# Patient Record
Sex: Male | Born: 1980 | Race: White | Hispanic: No | Marital: Single | Smoking: Current every day smoker
Health system: Southern US, Community
[De-identification: ages and names within clinical notes are randomized; demographics above are authoritative.]

## PROBLEM LIST (undated history)

## (undated) DIAGNOSIS — I10 Essential (primary) hypertension: Secondary | ICD-10-CM

## (undated) HISTORY — PX: BACK SURGERY: SHX140

---
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
## 1999-05-26 ENCOUNTER — Emergency Department (HOSPITAL_COMMUNITY): Admission: EM | Admit: 1999-05-26 | Discharge: 1999-05-26 | Payer: Self-pay | Admitting: Emergency Medicine

## 1999-05-26 ENCOUNTER — Encounter: Payer: Self-pay | Admitting: Emergency Medicine

## 1999-06-26 ENCOUNTER — Encounter: Payer: Self-pay | Admitting: Emergency Medicine

## 1999-06-27 ENCOUNTER — Inpatient Hospital Stay (HOSPITAL_COMMUNITY): Admission: EM | Admit: 1999-06-27 | Discharge: 1999-06-28 | Payer: Self-pay | Admitting: Emergency Medicine

## 2002-07-26 ENCOUNTER — Inpatient Hospital Stay (HOSPITAL_COMMUNITY): Admission: AD | Admit: 2002-07-26 | Discharge: 2002-07-30 | Payer: Self-pay | Admitting: Emergency Medicine

## 2003-12-19 ENCOUNTER — Ambulatory Visit (HOSPITAL_COMMUNITY): Admission: RE | Admit: 2003-12-19 | Discharge: 2003-12-20 | Payer: Self-pay | Admitting: Neurosurgery

## 2003-12-31 ENCOUNTER — Emergency Department (HOSPITAL_COMMUNITY): Admission: EM | Admit: 2003-12-31 | Discharge: 2003-12-31 | Payer: Self-pay | Admitting: Emergency Medicine

## 2004-08-05 ENCOUNTER — Emergency Department (HOSPITAL_COMMUNITY): Admission: EM | Admit: 2004-08-05 | Discharge: 2004-08-05 | Payer: Self-pay | Admitting: Family Medicine

## 2004-08-05 ENCOUNTER — Emergency Department (HOSPITAL_COMMUNITY): Admission: EM | Admit: 2004-08-05 | Discharge: 2004-08-05 | Payer: Self-pay | Admitting: Emergency Medicine

## 2004-09-14 ENCOUNTER — Emergency Department (HOSPITAL_COMMUNITY): Admission: AC | Admit: 2004-09-14 | Discharge: 2004-09-14 | Payer: Self-pay

## 2004-09-20 ENCOUNTER — Emergency Department (HOSPITAL_COMMUNITY): Admission: EM | Admit: 2004-09-20 | Discharge: 2004-09-20 | Payer: Self-pay | Admitting: *Deleted

## 2004-09-21 ENCOUNTER — Emergency Department (HOSPITAL_COMMUNITY): Admission: EM | Admit: 2004-09-21 | Discharge: 2004-09-21 | Payer: Self-pay | Admitting: Emergency Medicine

## 2004-10-11 ENCOUNTER — Emergency Department (HOSPITAL_COMMUNITY): Admission: EM | Admit: 2004-10-11 | Discharge: 2004-10-11 | Payer: Self-pay | Admitting: Emergency Medicine

## 2004-10-12 ENCOUNTER — Ambulatory Visit: Payer: Self-pay | Admitting: Nurse Practitioner

## 2005-04-11 ENCOUNTER — Emergency Department (HOSPITAL_COMMUNITY): Admission: EM | Admit: 2005-04-11 | Discharge: 2005-04-11 | Payer: Self-pay | Admitting: Emergency Medicine

## 2005-04-23 ENCOUNTER — Emergency Department (HOSPITAL_COMMUNITY): Admission: EM | Admit: 2005-04-23 | Discharge: 2005-04-24 | Payer: Self-pay | Admitting: Emergency Medicine

## 2005-05-03 ENCOUNTER — Emergency Department (HOSPITAL_COMMUNITY): Admission: EM | Admit: 2005-05-03 | Discharge: 2005-05-03 | Payer: Self-pay | Admitting: Emergency Medicine

## 2005-05-16 ENCOUNTER — Emergency Department (HOSPITAL_COMMUNITY): Admission: EM | Admit: 2005-05-16 | Discharge: 2005-05-16 | Payer: Self-pay | Admitting: Emergency Medicine

## 2005-05-31 ENCOUNTER — Emergency Department (HOSPITAL_COMMUNITY): Admission: EM | Admit: 2005-05-31 | Discharge: 2005-05-31 | Payer: Self-pay | Admitting: Emergency Medicine

## 2005-07-29 ENCOUNTER — Emergency Department (HOSPITAL_COMMUNITY): Admission: EM | Admit: 2005-07-29 | Discharge: 2005-07-29 | Payer: Self-pay | Admitting: *Deleted

## 2005-09-28 ENCOUNTER — Emergency Department (HOSPITAL_COMMUNITY): Admission: EM | Admit: 2005-09-28 | Discharge: 2005-09-28 | Payer: Self-pay | Admitting: Emergency Medicine

## 2005-10-23 ENCOUNTER — Emergency Department (HOSPITAL_COMMUNITY): Admission: EM | Admit: 2005-10-23 | Discharge: 2005-10-23 | Payer: Self-pay | Admitting: Emergency Medicine

## 2006-04-03 ENCOUNTER — Emergency Department (HOSPITAL_COMMUNITY): Admission: EM | Admit: 2006-04-03 | Discharge: 2006-04-04 | Payer: Self-pay | Admitting: Emergency Medicine

## 2006-05-17 ENCOUNTER — Emergency Department (HOSPITAL_COMMUNITY): Admission: EM | Admit: 2006-05-17 | Discharge: 2006-05-17 | Payer: Self-pay | Admitting: *Deleted

## 2006-07-21 ENCOUNTER — Emergency Department (HOSPITAL_COMMUNITY): Admission: EM | Admit: 2006-07-21 | Discharge: 2006-07-21 | Payer: Self-pay | Admitting: Emergency Medicine

## 2007-01-31 ENCOUNTER — Emergency Department (HOSPITAL_COMMUNITY): Admission: EM | Admit: 2007-01-31 | Discharge: 2007-01-31 | Payer: Self-pay | Admitting: Emergency Medicine

## 2007-04-17 ENCOUNTER — Emergency Department (HOSPITAL_COMMUNITY): Admission: EM | Admit: 2007-04-17 | Discharge: 2007-04-17 | Payer: Self-pay | Admitting: Emergency Medicine

## 2007-05-23 ENCOUNTER — Emergency Department (HOSPITAL_COMMUNITY): Admission: EM | Admit: 2007-05-23 | Discharge: 2007-05-23 | Payer: Self-pay | Admitting: Emergency Medicine

## 2007-05-28 ENCOUNTER — Emergency Department (HOSPITAL_COMMUNITY): Admission: EM | Admit: 2007-05-28 | Discharge: 2007-05-28 | Payer: Self-pay | Admitting: Emergency Medicine

## 2007-06-04 ENCOUNTER — Emergency Department (HOSPITAL_COMMUNITY): Admission: EM | Admit: 2007-06-04 | Discharge: 2007-06-04 | Payer: Self-pay | Admitting: Emergency Medicine

## 2007-06-19 ENCOUNTER — Inpatient Hospital Stay (HOSPITAL_COMMUNITY): Admission: EM | Admit: 2007-06-19 | Discharge: 2007-06-20 | Payer: Self-pay | Admitting: Emergency Medicine

## 2008-09-28 IMAGING — CR DG PELVIS 1-2V
1 series · 1 of 1 positions shown · non-contrast
Comparison: Intraoperative radiograph 12/19/2003

CLINICAL DATA: Trauma, fall, back pain

PELVIS - 1-2 VIEW

[t pelvis a.p.]
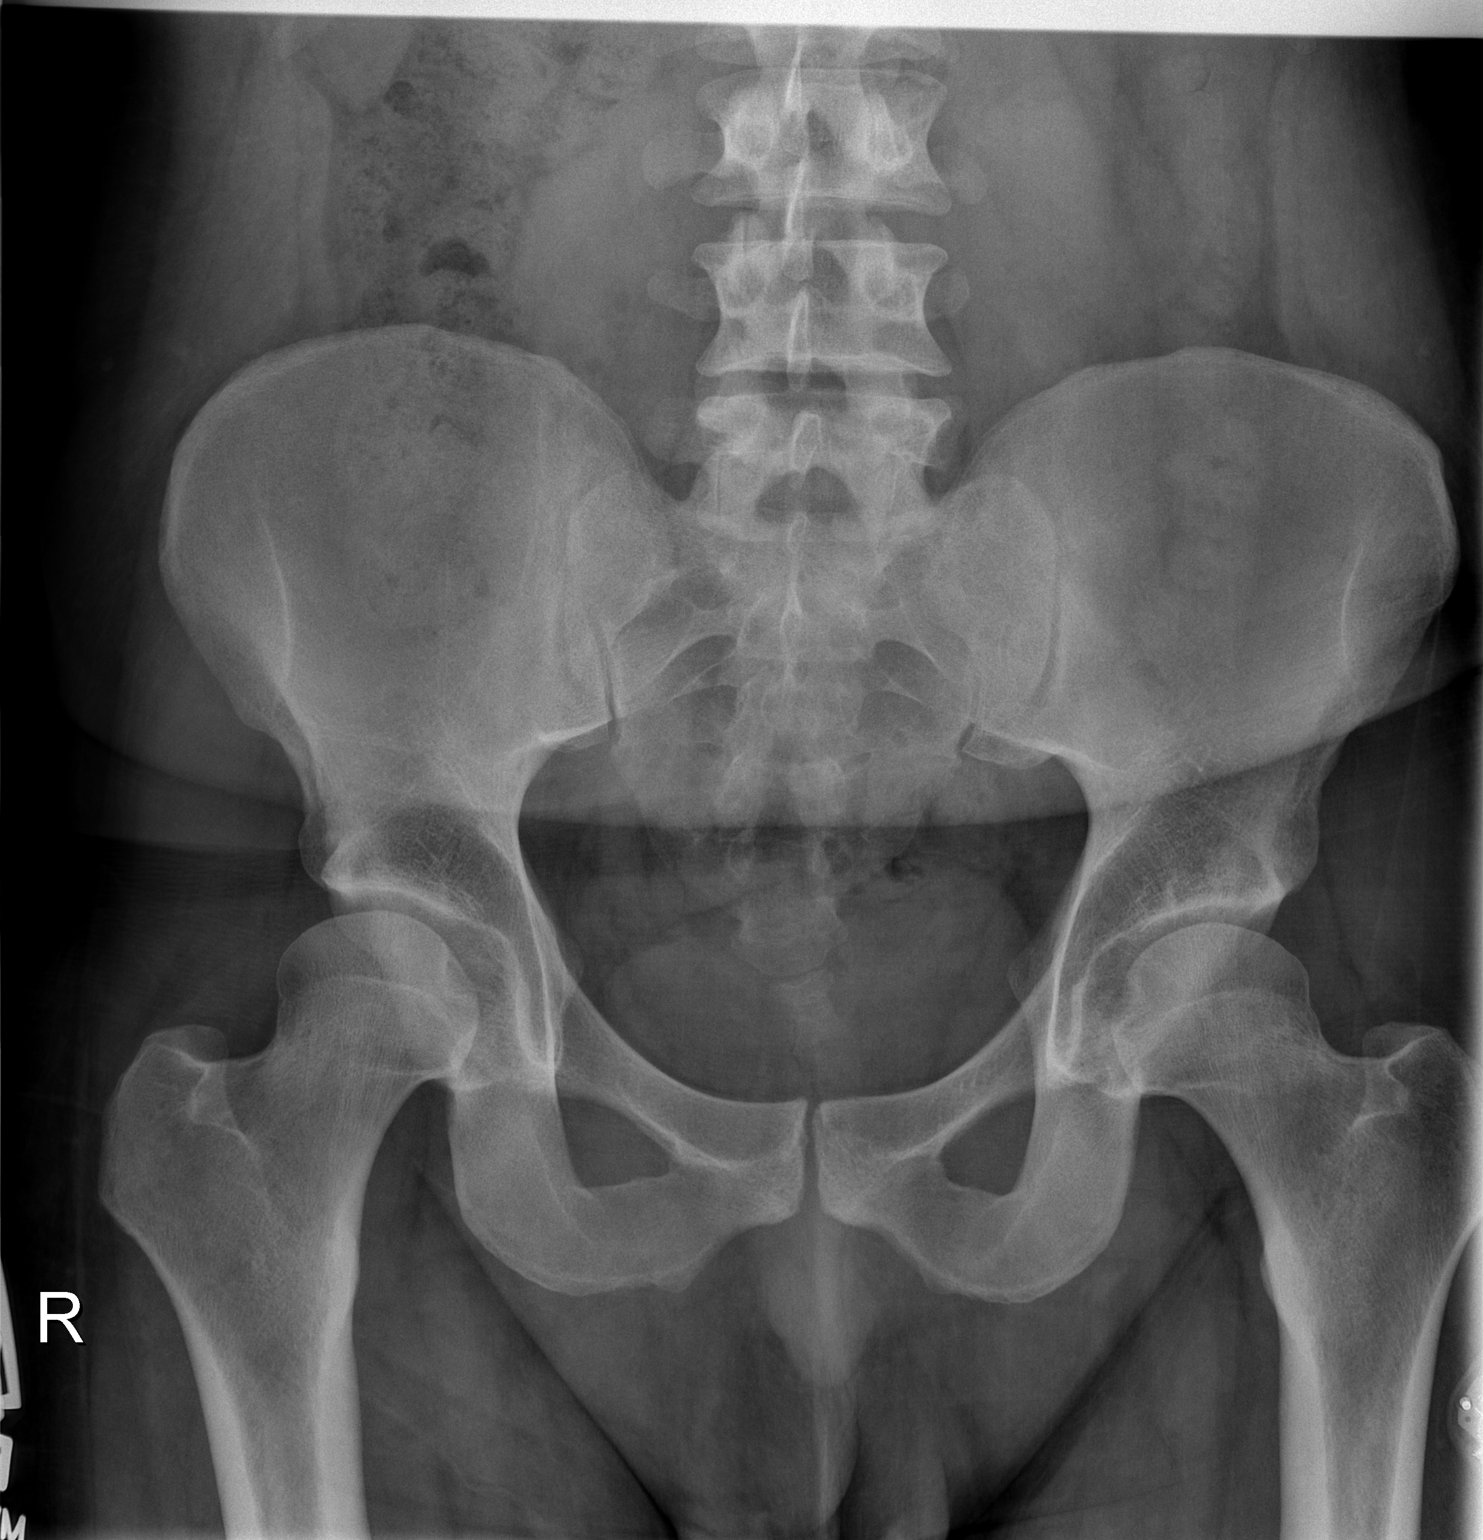

[1 of 1 positions shown; findings below may reference images not displayed]

FINDINGS: No fractures identified on this single AP pelvis.  No
acute abnormality is identified.
IMPRESSION: No acute abnormality.

## 2008-10-25 IMAGING — CT CT HEAD W/O CM
1 series · 16 of 30 positions shown, 20 images · non-contrast
Comparison: CT head 09/28/2005.

CLINICAL DATA: Possible overdose, medical clearance

CT HEAD WITHOUT CONTRAST
TECHNIQUE: Contiguous axial images were obtained from the base of
the skull through the vertex without contrast.

[Series 2: headseq 4.8 h37s · axial · 0.43mm/px · z∈[+1120,+1280]mm · 16 of 36 slices shown, 20 images]
[im 2/36  brain]
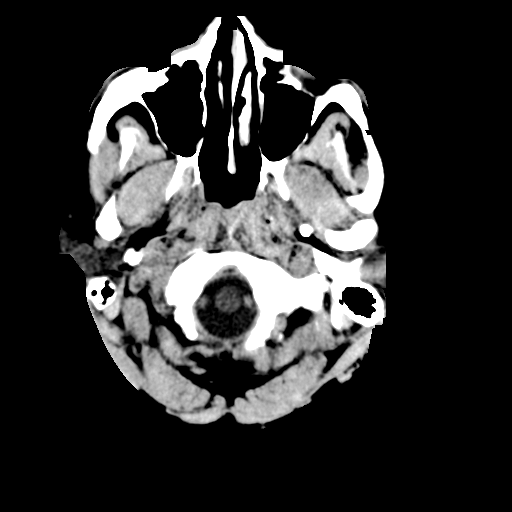
[im 2/36  bone]
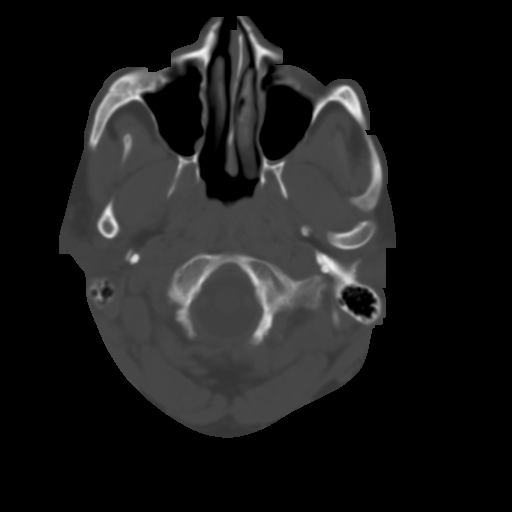
[im 4/36  brain]
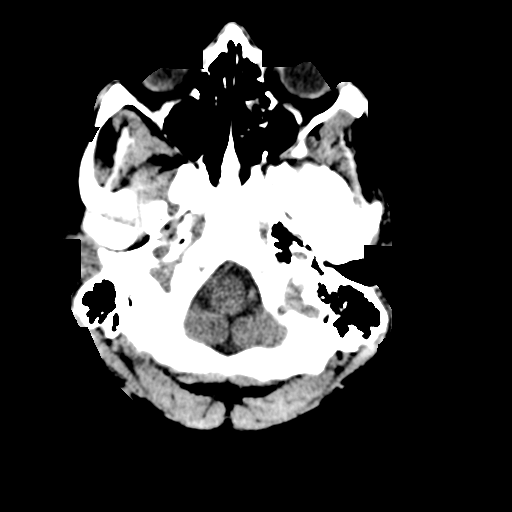
[im 7/36  brain]
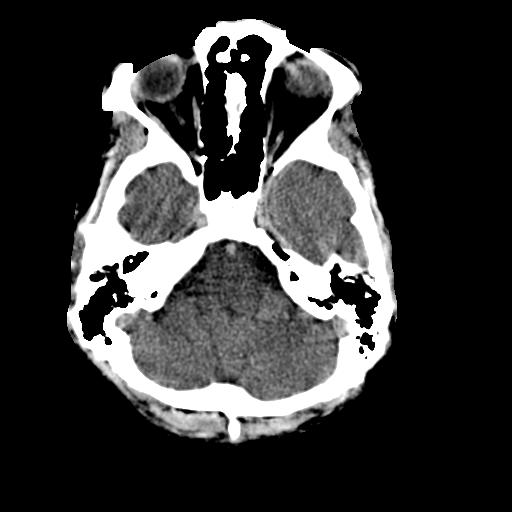
[im 9/36  brain]
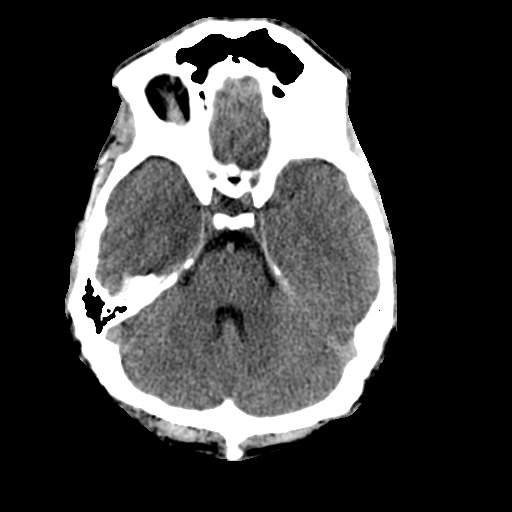
[im 10/36  brain]
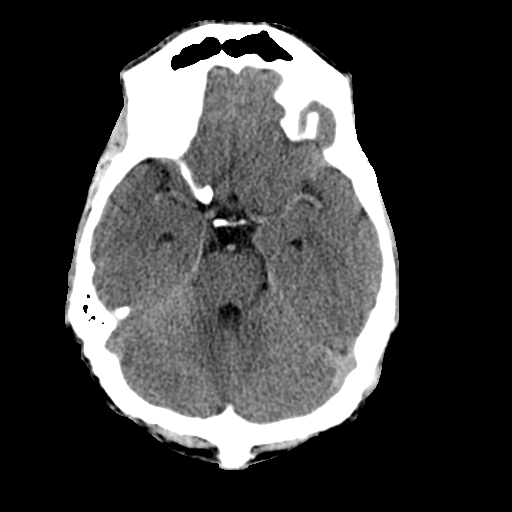
[im 10/36  bone]
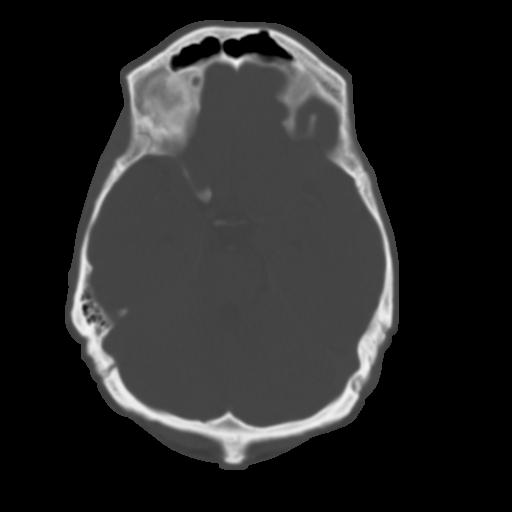
[im 13/36  brain]
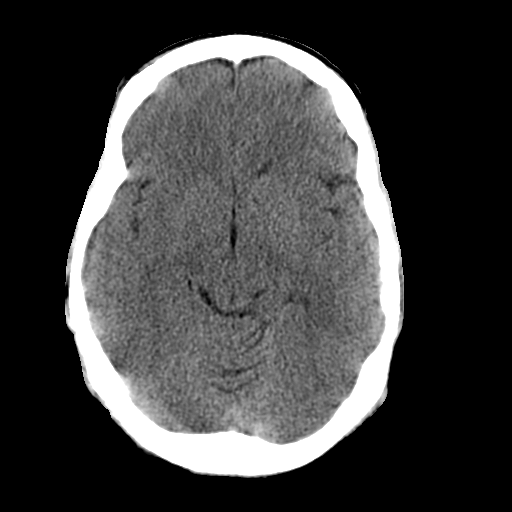
[im 15/36  brain]
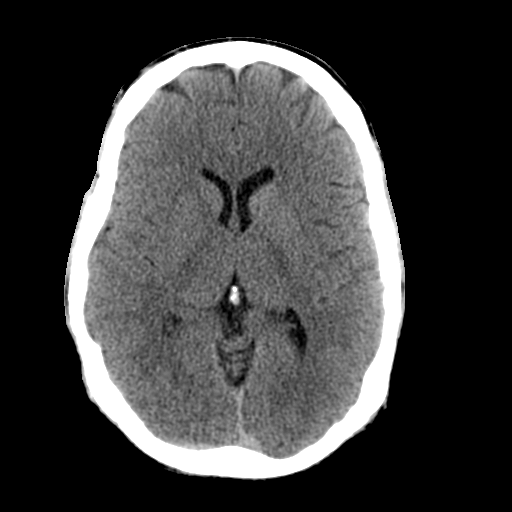
[im 17/36  brain]
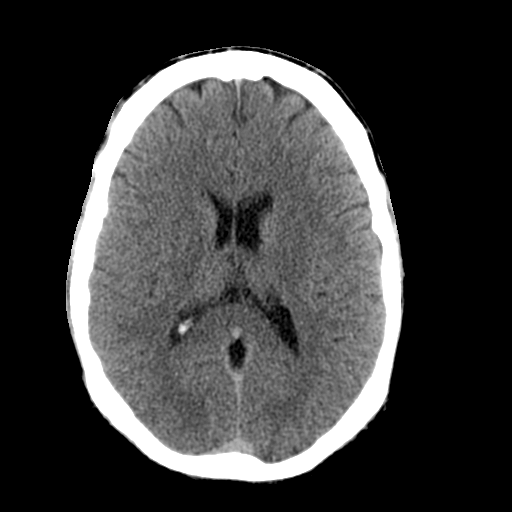
[im 19/36  brain]
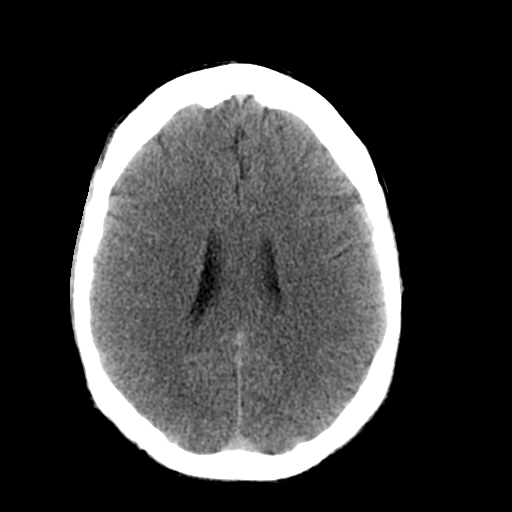
[im 19/36  bone]
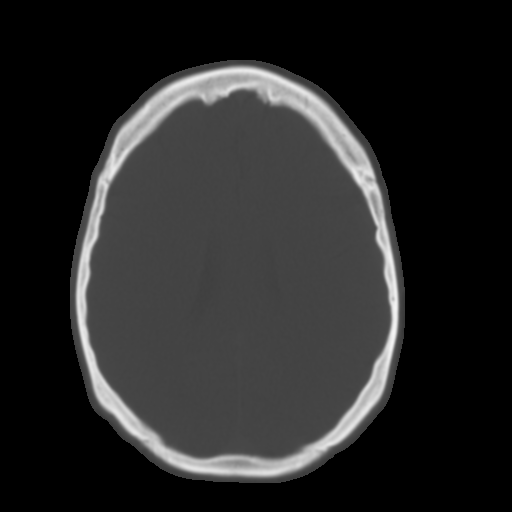
[im 21/36  brain]
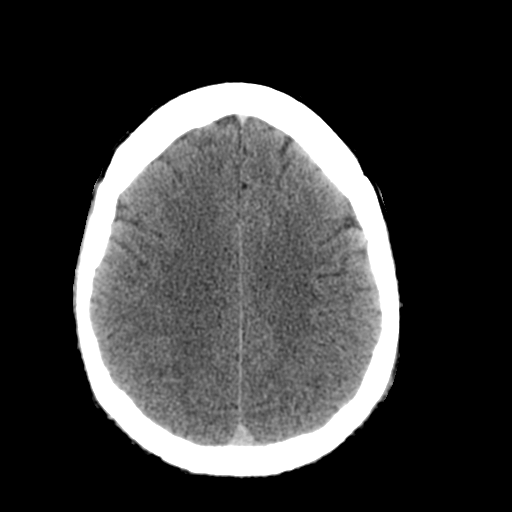
[im 23/36  brain]
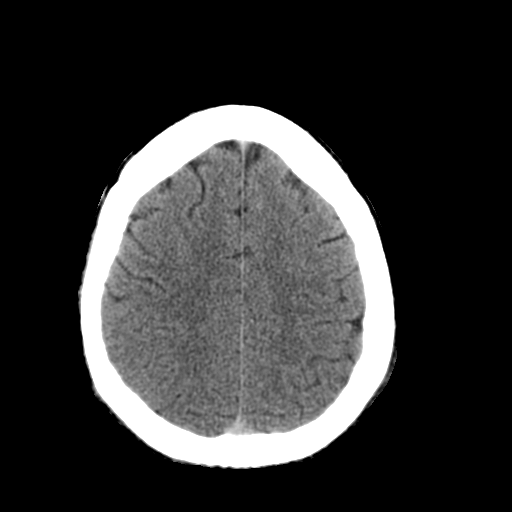
[im 26/36  brain]
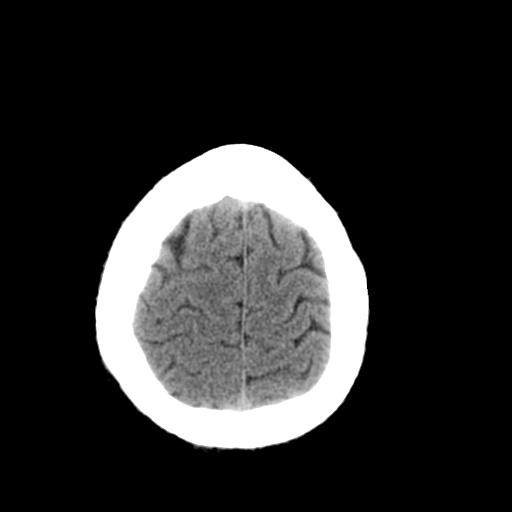
[im 27/36  brain]
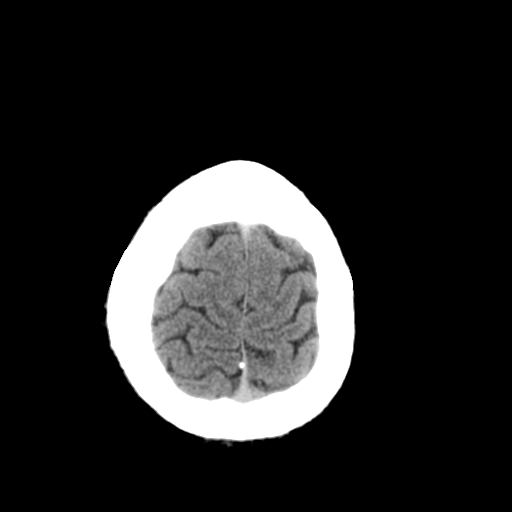
[im 27/36  bone]
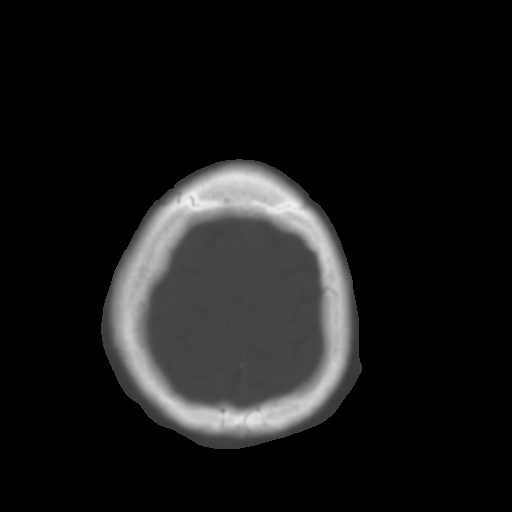
[im 29/36  brain]
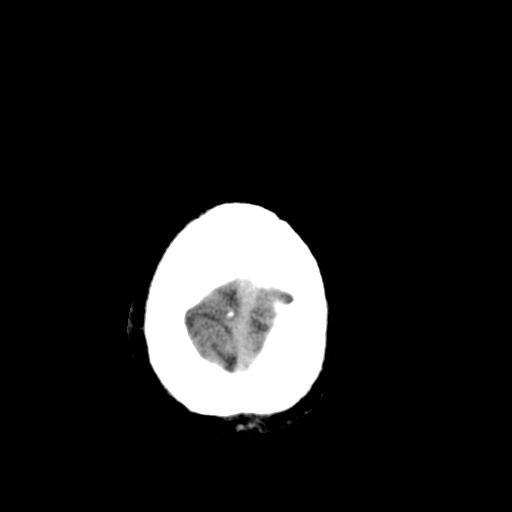
[im 32/36  brain]
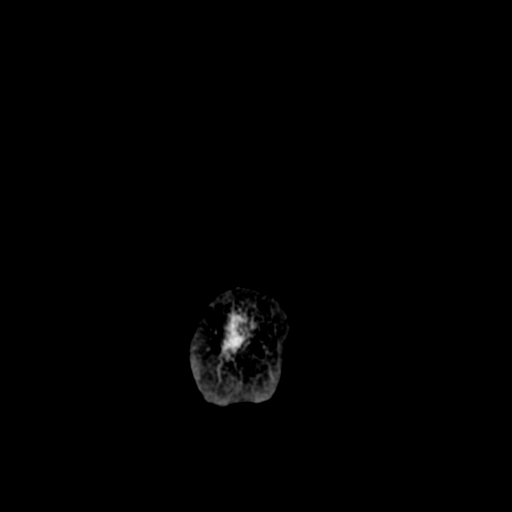
[im 34/36  brain]
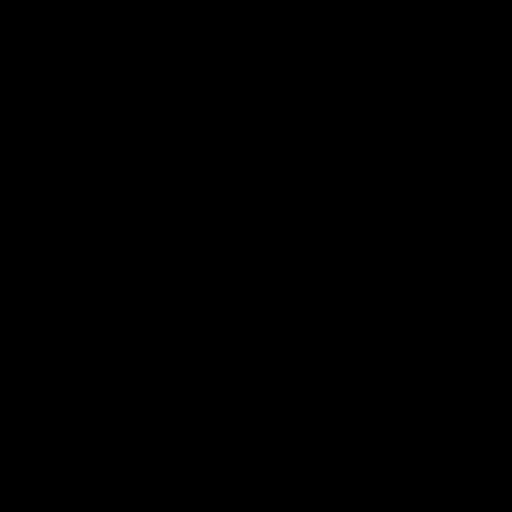

[16 of 30 positions shown; findings below may reference images not displayed]

FINDINGS: There is no evidence for acute infarction, intracranial
hemorrhage, mass lesion, hydrocephalus, or extra-axial fluid.
There is no atrophy or white matter disease.  Calvarium is intact.
The paranasal sinuses and mastoids grossly clear.
IMPRESSION: Negative study, with no interval change from priors

## 2010-05-18 NOTE — H&P (Signed)
NAME:  Brian Hobbs, Brian Hobbs           ACCOUNT NO.:  [id number]   MEDICAL RECORD NO.:  [id number]          PATIENT TYPE:  INP   LOCATION:  A337                          FACILITY:  [facility]   PHYSICIAN:  Lucita Ferrara, MD         DATE OF BIRTH:  Mar 01, 1980   DATE OF ADMISSION:  06/19/2007  DATE OF DISCHARGE:  LH                              HISTORY & PHYSICAL   The patient is a 30 year old male brought in after he was trying to  break entry.  He apparently thereafter took a bunch of his  benzodiazepines.  He was in the emergency room sedated with altered  mental status.  He has a history of chronic lower back pain and per ED  records has numerous admissions for back pain to the emergency room.  He  was given Romazicon several times in the emergency room, with no relief.  I was called for 24-hour observation secondary to him not being able to  get up.  Studies in the emergency room pretty were much negative.   PAST MEDICAL HISTORY:  Significant for back pain.   SURGICAL HISTORY:  He has had numerous back surgeries in the past.   SOCIAL HISTORY:  He is a current smoker.  Occasional drinker.  He has a  history of former drug abuse.   REVIEW OF SYSTEMS:  He uses Xanax on a regular basis.   ALLERGIES:  No known drug allergies.   PHYSICAL EXAMINATION:  GENERAL:  The patient was pretty sedated when I  looked at him today.  VITAL SIGNS:  Stable.  Temperature 98.3, blood pressure 130/80, pulse  72, respirations 14.  HEENT:  Normocephalic, atraumatic.  CARDIOVASCULAR:  S1, S2.  Regular rate and rhythm.  ABDOMEN:  Soft, nontender.  Positive bowel sounds.  LUNGS:  Clear to auscultation bilaterally.  No rhonchi, rales or  wheezes.  EXTREMITIES:  No clubbing, cyanosis or edema.  NEURO:  Cannot be fully assessed secondary to the patient's mental  status.   LABORATORY DATA:  Basic metabolic panel normal.  Blood alcohol level  less than 5.  Salicylate level less than 4.  Acetaminophen level less  than 10.  Urine drug screen positive for benzodiazepines.  UA negative.  CT scan of the head negative.   ASSESSMENT/PLAN:  A 30 year old with benzodiazepine overdose, altered  mental status.  We will plan to observe him for 24 hours and let the  benzodiazepine wear off.  IV fluids.  Will give him a banana bag  overnight.  Hemodynamic monitoring for now.  The rest of the plans are  really dependent on his progress.  Will give him some IV fluids.      Lucita Ferrara, MD  Electronically Signed     RR/MEDQ  D:  06/19/2007  T:  06/19/2007  Job:  [id number]

## 2010-05-18 NOTE — Discharge Summary (Signed)
NAMERASHAUN, WICHERT           ACCOUNT NO.:  [id number]   MEDICAL RECORD NO.:  [id number]          PATIENT TYPE:  INP   LOCATION:  A337                          FACILITY:  [facility]   PHYSICIAN:  Osvaldo Shipper, MD     DATE OF BIRTH:  08-30-1980   DATE OF ADMISSION:  06/19/2007  DATE OF DISCHARGE:  06/17/2009LH                               DISCHARGE SUMMARY   Please review H&P dictated by Dr. Flonnie Overman for details regarding the  patient's presenting illness.   DISCHARGE DIAGNOSES:  1. Recreational benzodiazepine use, with overdose.  2. History of chronic back pain.  3. Tobacco abuse.   BRIEF HOSPITAL COURSE:  Briefly, this is a 30 year old Caucasian male  who was caught trying to break and enter.  He was arrested by the law  enforcement, and apparently some time after that he took his Xanax.  The  patient tells me he might have taken 30-40 pills.  When he was seen in  the ED yesterday, he was not responsive.  He was able to maintain his  respirations.  The patient was observed overnight in the hospital.  This  morning, he is sleepy but easily arousable.  He was able to maintain a  conversation with me.  He tells me that he took it to make him feel  good.  He denies any suicidal or homicidal ideation.  He denies any  history of psychiatric disorder.  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   So, this morning he denies any complaints.  His vital signs are all  stable, saturating 100% on room air.  His blood pressure is 105/82.  His  examination is completely unremarkable.  He did not have any labs done  this morning.  Labs from yesterday were within reasonable limits.   So, overall he is stable medically for discharge.  The question is, does  he need psychiatric evaluation.  He admits to using these medications  for recreational purposes.  There is no real psych reason for this  overdose, so I do not think he  needs to be seen by a psychiatrist at  this point.  So, the patient can be discharged to the prison with the  law enforcement.   DISCHARGE MEDICATIONS:  He takes oxycodone at home, which he may  continue to do so; he takes 15 mg, he says every few hours.  He may  continue to so maybe starting tonight or tomorrow morning.  Otherwise,  he could not tell me what other medications he is on at home.  I would  obviously not give him any Xanax.   FOLLOWUP:  With PMD as needed.   DISPOSITION:  Patient to go to the prison with law enforcement.   TOTAL TIME OF DISCHARGE:  Less than 30 minutes.      Osvaldo Shipper, MD  Electronically Signed     GK/MEDQ  D:  06/20/2007  T:  06/20/2007  Job:  [id number]

## 2010-05-21 NOTE — Discharge Summary (Signed)
[place]  Patient:    Brian Hobbs, Brian Hobbs                  MRN: [id number] Adm. Date:  [id number] Disc. Date: [id number] Attending:  Dominica Severin Dictator:   Marveen Reeks Dasnoit, P.A.                           Discharge Summary  NO DICTATION DD:  07/27/99 TD:  07/29/99 Job: [id number]

## 2010-05-21 NOTE — H&P (Signed)
NAME:  Brian Hobbs, Brian Hobbs                     ACCOUNT NO.:  [id number]   MEDICAL RECORD NO.:  [id number]                   PATIENT TYPE:  INP   LOCATION:  1823                                 FACILITY:  [facility]   PHYSICIAN:  Gabrielle Dare. Janee Morn, M.D.             DATE OF BIRTH:  1980-10-10   DATE OF ADMISSION:  07/26/2002  DATE OF DISCHARGE:                                HISTORY & PHYSICAL   CHIEF COMPLAINT:  Dog bite to right thigh.   HISTORY OF PRESENT ILLNESS:  The patient is a 30 year old white male who  suffered a dog bite to his right upper thigh by a large [place].  The patient was evaluated in the emergency department.  The emergency  department physician was unable to get hemostasis in the wound and I was  consulted to evaluate.  Currently, the patient complains of localized pain  and swelling in the area.  He has no other current complaints.   PAST MEDICAL HISTORY:  Negative.   PAST SURGICAL HISTORY:  Left hand muscle tendon repair and right hand dog  bite surgery.   SOCIAL HISTORY:  He smokes cigarettes and drinks alcohol pretty regularly.   MEDICATIONS:  None.   ALLERGIES:  No known drug allergies.   REVIEW OF SYSTEMS:  GENERAL:  No complaints.  PULMONARY:  No complaints.  CARDIOVASCULAR:  No complaints.  GI:  No complaints.  MUSCULOSKELETAL:  See  the history of present illness.  NEUROLOGIC:  No complaints.  PSYCHIATRIC:  No complaints.  The remainder of the review of systems was negative.   PHYSICAL EXAMINATION:  VITAL SIGNS:  Pulse 75, blood pressure 159/49,  respirations 16, temperature 99.3, saturations 100%.  SKIN:  Warm.  HEENT:  Normocephalic, atraumatic.  Eye exam:  Extraocular muscles were  intact.  Pupils are 2 mm, equal, round, and reactive.  Ears are normal  externally.  Jaw and mouth show no fracture or laceration.  NECK:  Nontender with no swelling.  His trachea is in the midline.  CHEST:  Clear to auscultation bilaterally.  HEART:   Regular rate and rhythm.  ABDOMEN:  Benign.  BACK:  Atraumatic.  EXTREMITIES:  There are two large complex lacerations in his right upper  inner thigh with some active oozing and a large 15-cm associated hematoma.  NEUROLOGIC:  He is alert and oriented.  GCS is 15.  Sensation and motor are  intact in upper and lower extremities although his right lower extremity  exam is limited to somewhat localized by pain in the area around his wound.  VASCULAR:  Carotid, radial, popliteal, dorsalis pedis pulses are 2+ and  equal bilaterally.  Posterior tibial pulses are 1+ and equal bilaterally.   LABORATORY DATA:  Hemoglobin 13.7, hematocrit 38.3.  PT 13.9, PTT 27, INR of  1.1.   IMPRESSION:  A 30 year old white male with a dog bite in the right lower  extremity.   PLAN:  Take  him to the operating room emergently for exploration and  incision and debridement, control of any bleeding vasculature, and placement  of a VAC closure.  We will admit him for several days afterwards to receive  intravenous antibiotics.                                               Gabrielle Dare Janee Morn, M.D.    BET/MEDQ  D:  07/26/2002  T:  07/26/2002  Job:  [id number]

## 2010-05-21 NOTE — Op Note (Signed)
NAMEKI, CORBO           ACCOUNT NO.:  [id number]   MEDICAL RECORD NO.:  [id number]          PATIENT TYPE:  OIB   LOCATION:  3015                         FACILITY:  [facility]   PHYSICIAN:  Reinaldo Meeker, M.D. 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   PROCEDURE:  Right L4-5 intralaminal laminotomy for excision of herniated  disc with the operating microscope.   SECONDARY PROCEDURE:  Microdissection L4-5 disc and L5 nerve root.   SURGEON:  Reinaldo Meeker, M.D.   ASSISTANT:  Donalee Citrin, M.D.   PROCEDURE IN DETAIL:  After being placed in the prone position, the  patient's back was prepped and draped in the usual sterile fashion.  A  localizing x-ray was taken prior to incision to identify the appropriate  level.  Midline incision was made above the spinous processes of L4 and L5.  Using Bovie cutting current, the incision was carried down to the spinous  processes.  Subperiosteal dissection was then carried out along the right  side of the spinous processes and lamina, and a self-retaining retractor was  placed for exposure.  Second x-ray was taken to confirm the appropriate  level, and this was correct.  Using the high-speed drill, the inferior one-  third of the L4 lamina and medial one-third of the facet joint were removed.  Drill was then used to remove the superior one-third of the L5 lamina.  Residual bone and ligamentum flavum were removed in a piecemeal fashion.  The microscope was draped, brought in the field, and used until the end of  the case.  Using microdissection technique, the lateral aspect of the thecal  sac and L5 nerve were identified.  L4-5 disc was identified and found to be  __________  herniated beneath the nerve root and  thecal sac.  After  coagulating on the annulus, the annulus was incised with a 15 blade.  Using  pituitary rongeurs and curets, disc space cleanout was carried out with  care.  Great care was taken to avoid injury to the neural elements, and this  was successfully done.  At this point, inspection was carried out in all  directions to find any evidence of residual compression, and none could be  identified.  Large amounts of irrigation were carried out and any bleeding  controlled by bipolar coagulation and Gelfoam.  Wound was then closed using  interrupted in the muscle, fascia, subcutaneous, and subcuticular tissues,  and staples on the skin.  A sterile dressing was applied.  The patient was  extubated and taken to the recovery room in stable condition.       ROK/MEDQ  D:  12/19/2003  T:  12/21/2003  Job:  [id number]

## 2010-05-21 NOTE — Discharge Summary (Signed)
NAME:  Brian Hobbs, Brian Hobbs                     ACCOUNT NO.:  [id number]   MEDICAL RECORD NO.:  [id number]                   PATIENT TYPE:  INP   LOCATION:  5727                                 FACILITY:  [facility]   PHYSICIAN:  Gabrielle Dare. Janee Morn, M.D.             DATE OF BIRTH:  [birth year]   DATE OF ADMISSION:  07/26/2002  DATE OF DISCHARGE:  07/30/2002                                 DISCHARGE SUMMARY   ADMITTING TRAUMA SURGEON:  Gabrielle Dare. Janee Morn, M.D.   OTHER TRAUMA SURGEON:  Jimmye Norman, M.D.   DISCHARGE DIAGNOSES:  Dog bite to right medial thigh with large residual  hematoma on presentation.   PROCEDURES:  1. Status post irrigation and debridement, hemostasis, and application of     VAC dressing to right upper thigh 07/26/2002, Dr. Violeta Gelinas.  2. Delayed primary closure of right thigh wound, 9 x 4 cm at time of     closure, July 30, 2002, Dr. Jimmye Norman.   HISTORY:  This is a 30 year old male who suffered a dog bit to his right  upper thigh by a large [place]. The patient was evaluated by the  emergency department, and the physician there was unable to get hemostasis  in the wound.  The patient had significant localized swelling to area.  He  had a very large hematoma, and it was felt that hemostasis would only be  achieved by operative management.  The patient was, therefore, taken to the  OR on July 26, 2002, for irrigation and debridement, hemostasis of small  bleeders.  There was no evidence for significant vascular injury, although  application of a VAC dressing per Dr. Janee Morn at this time.  General  anesthesia without intraoperative complications.   The patient was continued on intravenous antibiotics postoperatively.  He  tolerated  wound VAC well, and the VAC was removed on July 29, 2002.  The  patient was taken back to the OR on July 30, 2002, for delayed primary  closure of his right thigh wound under MAC without complications.  He was  discharged  home later the same day on July 30, 2002, in stable and improved  condition.   DISCHARGE MEDICATIONS:  1. Keflex 500 mg 1 p.o. four times a day until al taken.  2. Tylox 1 to 2 p.o. q.4-6h. p.r.n. pain, 40 with no refills.  3. Tylenol or ibuprofen as needed for milder pain.   ACTIVITY:  To tolerance.  He is not to return to work and no driving until  cleared by trauma service.   FOLLOW UP:  He is to follow up with trauma service August 3 at 9:50 a.m. or  sooner should he have difficulty in the interim.      Shawn Rayburn, P.A.                       Gabrielle Dare Janee Morn, M.D.   SR/MEDQ  D:  08/20/2002  T:  08/21/2002  Job:  [id number]

## 2010-05-21 NOTE — Op Note (Signed)
NAME:  Brian Hobbs, Brian Hobbs                     ACCOUNT NO.:  [id number]   MEDICAL RECORD NO.:  [id number]                   PATIENT TYPE:  INP   LOCATION:  1823                                 FACILITY:  [facility]   PHYSICIAN:  Gabrielle Dare. Janee Morn, M.D.             DATE OF BIRTH:  17-Sep-1980   DATE OF PROCEDURE:  07/26/2002  DATE OF DISCHARGE:                                 OPERATIVE REPORT   PREOPERATIVE DIAGNOSIS:  Dog bite right upper thigh with active hemorrhage.   POSTOPERATIVE DIAGNOSES:  1. Dog bite right upper thigh with active hemorrhage.  2. No significant vascular injury.   OPERATION:  1. Irrigation and debridement.  2. Evacuation of hematoma.  3. Control of bleeding.  4. Application of dressing.   SURGEON:  Gabrielle Dare. Janee Morn, M.D.   ANESTHESIA:  General   HISTORY OF PRESENT ILLNESS:  The patient is a 30 year old white male who  suffered a dog bite to his right upper thigh by a large [place].  The  patient was evaluated in the emergency department.  They were unable to stop  the bleeding from the wound and I was called and asked to evaluate.  He had  a large hematoma in the upper medial right thigh with some severely confused  damaged tissue and I brought him emergently to the operating room for  exploration, irrigation and debridement.   DESCRIPTION OF PROCEDURE:  Informed consent was obtained.  The patient  received intravenous antibiotics in the emergency department.  He was  brought to the operating room and general anesthesia was administered.  The  patient's right groin and leg were prepped and draped in sterile fashion.  The wound was debrided circumferentially.  It initially comprised two  separate 3 cm wounds with some jagged tissue in between.  There was a large  15 cm hematoma with contused skin surrounding the bite site.  The tissue  around the bite was circumferentially excised down through the skin.  Large  clot was then evacuated.  Several  areas of necrotic appearing and torn  muscle were sharply debrided.  Once the underlying hematoma was completely  evacuated, there was noted to be active bleeding from a small muscle  perforator artery.  This was suture ligated with 2-0 silk. Once that was  accomplished, no further bleeding sites were noted.  The artery was palpable  well deep to the involved area and the patient did have an intact  neurovascular exam preoperatively.  At this time, I proceeded with pulse  lavage with several liters of saline. Once this was accomplished, we pulse  lavaged the wounds with antibiotic irrigation.  Once that was accomplished,  the wound was reinspected for hemostasis which was excellent except for  small areas along the edge.  These were cauterized.  Once that was  accomplished, the wound was closed by applying a vac dressing.  A medium  sponge was cut down to  size and fit in order to fill in a pocket superiorly  from the wound as well as encompassing intact  exposed wound.  The vac dressing was applied and suction was activated and a  good seal was obtained.  Sponge, needle and instrument counts were correct.  The patient tolerated the procedure well without any apparent complications.  He was taken to the recovery room in stable condition.                                               Gabrielle Dare Janee Morn, M.D.    BET/MEDQ  D:  07/26/2002  T:  07/26/2002  Job:  [id number]

## 2010-05-21 NOTE — Op Note (Signed)
NAME:  Brian Hobbs, Brian Hobbs                     ACCOUNT NO.:  [id number]   MEDICAL RECORD NO.:  [id number]                   PATIENT TYPE:  INP   LOCATION:  5727                                 FACILITY:  [facility]   PHYSICIAN:  Jimmye Norman III, M.D.               DATE OF BIRTH:  05-04-1980   DATE OF PROCEDURE:  07/30/2002  DATE OF DISCHARGE:  07/30/2002                                 OPERATIVE REPORT   PREOPERATIVE DIAGNOSES:  Open right thigh wound from dog bite, status post  VAC placement.   POSTOPERATIVE DIAGNOSES:  Open right thigh wound from dog bite, status post  VAC placement.   OPERATION PERFORMED:  Delayed primary closure of right thigh wound.  The  wound size was approximately 9 cm x 4 cm.   SURGEON:  Marta Lamas. Lindie Spruce, M.D.   ASSISTANT:  None.   ANESTHESIA:  Monitored anesthesia care with 1% Xylocaine and 0.25% Marcaine  in the final mixture as anesthesia.   ESTIMATED BLOOD LOSS:  Less than 5mL.   COMPLICATIONS:  None.   CONDITION:  Stable.   INDICATIONS FOR PROCEDURE:  The patient is a 30 year old who has sustained  an injury to his right thigh from a dog bite who now comes in for delayed  primary closure after several days of vacuum assisted closure device  placement.   OPERATIVE FINDINGS:  The patient had some undermining superiorly and  inferiorly with the largest flap being superiorly.  In order to __________,  there was minimal tension.  There was exposed muscle underneath but it all  appeared to be granulated well with no evidence of necrosis.   DESCRIPTION OF PROCEDURE:  The patient was taken to the operating room and  placed on the table in the supine position.  After an amount of IV sedation  was given, the patient was prepped and draped in the usual sterile manner  exposing the right thigh medially.  It was prepped with Betadine.  We cut  off about a millimeter of subcutaneous tissue and skin from the wound  margins.  This was a diagonal laceration  extending from the upper outer  portion to the inner lower portion of the thigh.  Once we had  circumferentially taken a full thickness 1 mm layer of tissue, we did a  primary closure using vertical mattress sutures of 2-0 nylon.  Approximately  10 such sutures were used.  We irrigated with saline prior to closure,  irrigated copiously and removed all necrotic debris.  No drains  were left in place.  Sterile dressing was applied.  All sponge, needle and  instrument counts were correct at the conclusion of the case.  Kathrin Ruddy, M.D.    JW/MEDQ  D:  07/30/2002  T:  07/30/2002  Job:  [id number]

## 2010-05-21 NOTE — Discharge Summary (Signed)
NAME:  Brian Hobbs, Brian Hobbs           ACCOUNT NO.:  [id number]   MEDICAL RECORD NO.:  [id number]          PATIENT TYPE:  EMS   LOCATION:  ED                            FACILITY:  [facility]   PHYSICIAN:  Sheppard Penton. Stacie Acres, M.D.  DATE OF BIRTH:  1980-02-02   DATE OF ADMISSION:  07/29/2005  DATE OF DISCHARGE:  07/29/2005                                 DISCHARGE SUMMARY   COMPLAINT:  Requesting narcotic medication.   A 30 year old white male who says he has been addicted to narcotics for  about 3 or 4 years. He has a long history that says he is going to be having  an MRI in about 2 weeks going through a pain center in about 2 weeks  hopefully having surgery by Dr. Gerlene Fee. He admits to searching around at  [facility], [facility], [place], [place] and now [place] for his narcotics. He said he last sought medication at [facility] in May and has been getting illegal narcotics off the street since  then. He has been told bad things about methadone treatment and right now  that is not one of his possibilities. He is asking for medication to last  him 2 more weeks until he starts his pain center treatment. He has chronic  back pain, history of disk disease, history of one surgery in the past.   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   MEDICATIONS:  Noted.   ALLERGIES:  None known.   FAMILY HISTORY:  No ongoing diseases.   PHYSICAL EXAMINATION:  Nurses notes reviewed.  GENERAL:  He is awake, alert, cooperative and too sluggish but articulate.  HEENT:  Normal.  HEART:  Normal.  LUNGS:  Normal.  CHEST:  Normal.  ABDOMEN:  Normal.  SKIN:  Normal.  EXTREMITIES:  Normal.  NEUROLOGIC:  Nonfocal exam, normal cognitive function although his speech is  slow and sluggish.   We discussed at length his addiction which he admits to and the need to get  narcotic detox. He has agreed that he is going to start  tonight. I called  the [facility] specialist on-call for [place] and Samson Frederic told me there is no  place to send him tonight. Based on that, I will prescribe the patient 10  Lortab 7.5 mg which should last one day and advised him that he must go to  [facility] tomorrow to start his detox. He seems to understand although  seems to doubt that this will help cure his ongoing problem. The goal is to  get the patient detoxed, perhaps on a methadone program  and get him off his chronic narcotic addiction at least to help his ongoing  pain control and ultimately his disk surgery.   DIAGNOSIS:  Narcotic addiction approximately 3 years duration.           ______________________________  Sheppard Penton. Stacie Acres, M.D.     NMM/MEDQ  D:  07/29/2005  T:  07/29/2005  Job:  [id number]
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
## 2010-07-26 ENCOUNTER — Emergency Department (HOSPITAL_COMMUNITY)
Admission: EM | Admit: 2010-07-26 | Discharge: 2010-07-26 | Disposition: A | Discharge status: Home / Self Care | Payer: Self-pay | Attending: Emergency Medicine | Admitting: Emergency Medicine

## 2010-07-26 DIAGNOSIS — L509 Urticaria, unspecified: Secondary | ICD-10-CM | POA: Insufficient documentation

## 2010-07-26 DIAGNOSIS — L259 Unspecified contact dermatitis, unspecified cause: Secondary | ICD-10-CM | POA: Insufficient documentation

## 2010-07-26 DIAGNOSIS — H02849 Edema of unspecified eye, unspecified eyelid: Secondary | ICD-10-CM | POA: Insufficient documentation

## 2010-09-30 LAB — URINALYSIS, ROUTINE W REFLEX MICROSCOPIC
Bilirubin Urine: NEGATIVE
Glucose, UA: NEGATIVE
Ketones, ur: NEGATIVE
Leukocytes, UA: NEGATIVE
Nitrite: NEGATIVE
Protein, ur: NEGATIVE
Specific Gravity, Urine: >1.03 — ABNORMAL HIGH

## 2010-09-30 LAB — SALICYLATE LEVEL: Salicylate Lvl: <4

## 2010-09-30 LAB — BASIC METABOLIC PANEL
BUN: 15
Calcium: 9.8
Creatinine, Ser: 0.77
GFR calc Af Amer: >60
Glucose, Bld: 103 — ABNORMAL HIGH

## 2010-09-30 LAB — TSH: TSH: 0.596

## 2010-09-30 LAB — RAPID URINE DRUG SCREEN, HOSP PERFORMED
Barbiturates: NOT DETECTED
Benzodiazepines: POSITIVE — AB
Tetrahydrocannabinol: NOT DETECTED

## 2010-09-30 LAB — CBC
HCT: 43.9
MCHC: 35.2
MCV: 96.9
WBC: 11.2 — ABNORMAL HIGH

## 2010-09-30 LAB — URINE MICROSCOPIC-ADD ON

## 2010-09-30 LAB — ACETAMINOPHEN LEVEL: Acetaminophen (Tylenol), Serum: <10 — ABNORMAL LOW

## 2010-09-30 LAB — DIFFERENTIAL: Eosinophils Relative: 1

## 2011-07-13 ENCOUNTER — Emergency Department (HOSPITAL_COMMUNITY)
Admission: EM | Admit: 2011-07-13 | Discharge: 2011-07-13 | Disposition: A | Discharge status: Home / Self Care | Payer: Self-pay | Attending: Emergency Medicine | Admitting: Emergency Medicine

## 2011-07-13 ENCOUNTER — Encounter (HOSPITAL_COMMUNITY): Payer: Self-pay | Admitting: *Deleted

## 2011-07-13 DIAGNOSIS — F17200 Nicotine dependence, unspecified, uncomplicated: Secondary | ICD-10-CM | POA: Insufficient documentation

## 2011-07-13 DIAGNOSIS — L509 Urticaria, unspecified: Secondary | ICD-10-CM | POA: Insufficient documentation

## 2011-07-13 MED ORDER — DIPHENHYDRAMINE HCL 25 MG PO CAPS: 25.0000 mg | ORAL_CAPSULE | Freq: Four times a day (QID) | ORAL | Status: DC | PRN

## 2011-07-13 MED ORDER — FAMOTIDINE 20 MG PO TABS: 20.0000 mg | ORAL_TABLET | Freq: Two times a day (BID) | ORAL | Status: DC

## 2011-07-13 MED ORDER — PREDNISONE 10 MG PO TABS: ORAL_TABLET | ORAL | Status: DC

## 2011-07-13 MED ORDER — METHYLPREDNISOLONE SODIUM SUCC 125 MG IJ SOLR: 125.0000 mg | Freq: Once | INTRAMUSCULAR | Status: DC

## 2011-07-13 MED ORDER — METHYLPREDNISOLONE SODIUM SUCC 125 MG IJ SOLR
125.0000 mg | Freq: Once | INTRAMUSCULAR | Status: AC
  Administered 2011-07-13: 125 mg via INTRAMUSCULAR
  Filled 2011-07-13: qty 2

## 2011-07-13 NOTE — ED Provider Notes (Signed)
Medical screening examination/treatment/procedure(s) were performed by non-physician practitioner and as supervising physician I was immediately available for consultation/collaboration.  Doug Sou, MD 07/13/11 [phone]

## 2011-07-13 NOTE — ED Notes (Signed)
Pt reports whelps starting last night with facial swelling, no whelps at present, states it happens after he takes a shower, has changed soaps many times

## 2011-07-13 NOTE — ED Provider Notes (Signed)
History     CSN: [id number]  Arrival date & time 07/13/11  1316   First MD Initiated Contact with Patient 07/13/11 1438      Chief Complaint  Patient presents with  . Rash    (Consider location/radiation/quality/duration/timing/severity/associated sxs/prior treatment) Patient is a 31 y.o. male presenting with rash. The history is provided by the patient.  Rash  This is a new problem. The current episode started more than 1 week ago. The problem has been gradually worsening.  Pt states he has redness and hives over his arms, chest, neck, for the last 2 weeks. Worsened with warm shower. States he has not changed detergent, soaps, personal products, foods  History reviewed. No pertinent past medical history.  Past Surgical History  Procedure Date  . Back surgery     No family history on file.  History  Substance Use Topics  . Smoking status: Current Everyday Smoker  . Smokeless tobacco: Not on file  . Alcohol Use: Yes     occ      Review of Systems  Constitutional: Negative for fever and chills.  HENT: Negative for facial swelling, drooling, mouth sores and voice change.   Respiratory: Negative.  Negative for shortness of breath.   Cardiovascular: Negative.   Gastrointestinal: Negative.   Musculoskeletal: Negative.   Skin: Positive for color change and rash.  Neurological: Negative for dizziness, weakness and headaches.    Allergies  Review of patient's allergies indicates no known allergies.  Home Medications   Current Outpatient Rx  Name Route Sig Dispense Refill  . OMEGA-3 FATTY ACIDS 1000 MG PO CAPS Oral Take 1 g by mouth daily.    . ADULT MULTIVITAMIN W/MINERALS CH Oral Take 1 tablet by mouth daily.    Marland Kitchen VITAMIN C 500 MG PO TABS Oral Take 500 mg by mouth daily.      BP 140/85  Pulse 105  Temp 98.7 F (37.1 C) (Oral)  Resp 18  SpO2 100%  Physical Exam  Nursing note and vitals reviewed. Constitutional: He is oriented to person, place, and time.  He appears well-developed and well-nourished. No distress.  HENT:  Head: Normocephalic.  Right Ear: External ear normal.  Nose: Nose normal.  Mouth/Throat: Oropharynx is clear and moist.  Eyes: Conjunctivae are normal. Pupils are equal, round, and reactive to light.  Neck: Normal range of motion. Neck supple.  Cardiovascular: Normal rate, regular rhythm and normal heart sounds.   Pulmonary/Chest: Effort normal and breath sounds normal. No respiratory distress. He has no wheezes. He has no rales.  Lymphadenopathy:    He has no cervical adenopathy.  Neurological: He is alert and oriented to person, place, and time.  Skin:       Skin erythema with excoriations over neck, chest, bilateral forearms, antecubital fossas, ankles. No papules or any current hives.   Psychiatric: He has a normal mood and affect.    ED Course  Procedures (including critical care time)  Pt with allergic reaction, reports rash and hives. No new products. No new foods. Hx of the same. Pt asking for shot of solumedrol. Will give a shot, prednisone for 5 days. Follow up with his pcp. No respiratory symptoms, no swelling of lips, tongue, throat, face.   1. Urticaria       MDM          Lottie Mussel, PA 07/13/11 [phone]

## 2021-03-05 ENCOUNTER — Encounter (HOSPITAL_COMMUNITY): Payer: Self-pay

## 2021-03-05 ENCOUNTER — Other Ambulatory Visit: Payer: Self-pay

## 2021-03-05 ENCOUNTER — Inpatient Hospital Stay (HOSPITAL_COMMUNITY)
Admission: EM | Admit: 2021-03-05 | Discharge: 2021-03-07 | DRG: 894 | Discharge status: Against Medical Advice | Payer: Self-pay | Attending: Internal Medicine | Admitting: Internal Medicine

## 2021-03-05 DIAGNOSIS — Z20822 Contact with and (suspected) exposure to covid-19: Secondary | ICD-10-CM | POA: Diagnosis present

## 2021-03-05 DIAGNOSIS — E871 Hypo-osmolality and hyponatremia: Secondary | ICD-10-CM | POA: Diagnosis present

## 2021-03-05 DIAGNOSIS — Z6837 Body mass index (BMI) 37.0-37.9, adult: Secondary | ICD-10-CM

## 2021-03-05 DIAGNOSIS — F10931 Alcohol use, unspecified with withdrawal delirium: Secondary | ICD-10-CM

## 2021-03-05 DIAGNOSIS — F17210 Nicotine dependence, cigarettes, uncomplicated: Secondary | ICD-10-CM | POA: Diagnosis present

## 2021-03-05 DIAGNOSIS — F1110 Opioid abuse, uncomplicated: Secondary | ICD-10-CM | POA: Diagnosis present

## 2021-03-05 DIAGNOSIS — F10939 Alcohol use, unspecified with withdrawal, unspecified: Secondary | ICD-10-CM

## 2021-03-05 DIAGNOSIS — F10231 Alcohol dependence with withdrawal delirium: Principal | ICD-10-CM | POA: Diagnosis present

## 2021-03-05 DIAGNOSIS — F1010 Alcohol abuse, uncomplicated: Secondary | ICD-10-CM

## 2021-03-05 DIAGNOSIS — F1190 Opioid use, unspecified, uncomplicated: Secondary | ICD-10-CM

## 2021-03-05 DIAGNOSIS — E669 Obesity, unspecified: Secondary | ICD-10-CM | POA: Diagnosis present

## 2021-03-05 DIAGNOSIS — Z5329 Procedure and treatment not carried out because of patient's decision for other reasons: Secondary | ICD-10-CM | POA: Diagnosis present

## 2021-03-05 DIAGNOSIS — Z79899 Other long term (current) drug therapy: Secondary | ICD-10-CM

## 2021-03-05 DIAGNOSIS — Z781 Physical restraint status: Secondary | ICD-10-CM

## 2021-03-05 DIAGNOSIS — F909 Attention-deficit hyperactivity disorder, unspecified type: Secondary | ICD-10-CM

## 2021-03-05 DIAGNOSIS — Y900 Blood alcohol level of less than 20 mg/100 ml: Secondary | ICD-10-CM | POA: Diagnosis present

## 2021-03-05 HISTORY — DX: Alcohol use, unspecified with withdrawal, unspecified: F10.939

## 2021-03-05 HISTORY — DX: Alcohol use, unspecified with withdrawal delirium: F10.931

## 2021-03-05 LAB — CBC WITH DIFFERENTIAL/PLATELET
Abs Immature Granulocytes: 0.04 10*3/uL (ref 0.00–0.07)
Basophils Absolute: 0.1 10*3/uL (ref 0.0–0.1)
Basophils Relative: 1 %
Eosinophils Absolute: 0.1 10*3/uL (ref 0.0–0.5)
Eosinophils Relative: 0 %
HCT: 41.9 % (ref 39.0–52.0)
Hemoglobin: 14 g/dL (ref 13.0–17.0)
Immature Granulocytes: 0 %
Lymphocytes Relative: 10 %
Lymphs Abs: 1.2 10*3/uL (ref 0.7–4.0)
MCH: 31.6 pg (ref 26.0–34.0)
MCHC: 33.4 g/dL (ref 30.0–36.0)
MCV: 94.6 fL (ref 80.0–100.0)
Monocytes Absolute: 0.5 10*3/uL (ref 0.1–1.0)
Monocytes Relative: 4 %
Neutro Abs: 10.9 10*3/uL — ABNORMAL HIGH (ref 1.7–7.7)
Neutrophils Relative %: 85 %
Platelets: 370 10*3/uL (ref 150–400)
RBC: 4.43 MIL/uL (ref 4.22–5.81)
RDW: 13.1 % (ref 11.5–15.5)
WBC: 12.8 10*3/uL — ABNORMAL HIGH (ref 4.0–10.5)
nRBC: 0 % (ref 0.0–0.2)

## 2021-03-05 LAB — COMPREHENSIVE METABOLIC PANEL
ALT: 24 U/L (ref 0–44)
AST: 37 U/L (ref 15–41)
Albumin: 4.1 g/dL (ref 3.5–5.0)
Alkaline Phosphatase: 57 U/L (ref 38–126)
Anion gap: 10 (ref 5–15)
BUN: 9 mg/dL (ref 6–20)
CO2: 25 mmol/L (ref 22–32)
Calcium: 9 mg/dL (ref 8.9–10.3)
Chloride: 99 mmol/L (ref 98–111)
Creatinine, Ser: 0.77 mg/dL (ref 0.61–1.24)
GFR, Estimated: >60 mL/min (ref >60)
Glucose, Bld: 94 mg/dL (ref 70–99)
Potassium: 4.3 mmol/L (ref 3.5–5.1)
Sodium: 134 mmol/L — ABNORMAL LOW (ref 135–145)
Total Bilirubin: 0.6 mg/dL (ref 0.3–1.2)
Total Protein: 7.5 g/dL (ref 6.5–8.1)

## 2021-03-05 LAB — SALICYLATE LEVEL: Salicylate Lvl: <7 mg/dL — ABNORMAL LOW (ref 7.0–30.0)

## 2021-03-05 LAB — ACETAMINOPHEN LEVEL: Acetaminophen (Tylenol), Serum: <10 ug/mL — ABNORMAL LOW (ref 10–30)

## 2021-03-05 LAB — RESP PANEL BY RT-PCR (FLU A&B, COVID) ARPGX2
Influenza A by PCR: NEGATIVE
Influenza B by PCR: NEGATIVE
SARS Coronavirus 2 by RT PCR: NEGATIVE

## 2021-03-05 LAB — MAGNESIUM: Magnesium: 2 mg/dL (ref 1.7–2.4)

## 2021-03-05 LAB — PHOSPHORUS: Phosphorus: 2.9 mg/dL (ref 2.5–4.6)

## 2021-03-05 LAB — ETHANOL: Alcohol, Ethyl (B): <10 mg/dL (ref <10)

## 2021-03-05 MED ORDER — ADULT MULTIVITAMIN W/MINERALS CH
1.0000 | ORAL_TABLET | Freq: Every day | ORAL | Status: DC
  Administered 2021-03-05 – 2021-03-07 (×3): 1 via ORAL
  Filled 2021-03-05 (×3): qty 1

## 2021-03-05 MED ORDER — LORAZEPAM 1 MG PO TABS
1.0000 mg | ORAL_TABLET | ORAL | Status: DC | PRN
  Administered 2021-03-06: 2 mg via ORAL
  Filled 2021-03-05: qty 2

## 2021-03-05 MED ORDER — LORAZEPAM 2 MG/ML IJ SOLN
0.0000 mg | INTRAMUSCULAR | Status: DC
  Administered 2021-03-05: 4 mg via INTRAVENOUS
  Administered 2021-03-06 (×3): 2 mg via INTRAVENOUS
  Administered 2021-03-06 – 2021-03-07 (×2): 4 mg via INTRAVENOUS
  Administered 2021-03-07 (×2): 2 mg via INTRAVENOUS
  Filled 2021-03-05: qty 2
  Filled 2021-03-05 (×4): qty 1
  Filled 2021-03-05: qty 2
  Filled 2021-03-05: qty 1

## 2021-03-05 MED ORDER — LORAZEPAM 1 MG PO TABS
0.0000 mg | ORAL_TABLET | Freq: Four times a day (QID) | ORAL | Status: DC
  Administered 2021-03-05: 1 mg via ORAL
  Filled 2021-03-05: qty 1

## 2021-03-05 MED ORDER — DEXMEDETOMIDINE HCL IN NACL 400 MCG/100ML IV SOLN
0.2000 ug/kg/h | INTRAVENOUS | Status: DC
  Administered 2021-03-05: 0.3 ug/kg/h via INTRAVENOUS
  Administered 2021-03-05: 0.2 ug/kg/h via INTRAVENOUS
  Administered 2021-03-06: 0.6 ug/kg/h via INTRAVENOUS
  Administered 2021-03-06: 0.699 ug/kg/h via INTRAVENOUS
  Administered 2021-03-06: 0.4 ug/kg/h via INTRAVENOUS
  Administered 2021-03-06: 0.7 ug/kg/h via INTRAVENOUS
  Administered 2021-03-07: 0.699 ug/kg/h via INTRAVENOUS
  Administered 2021-03-07: 0.7 ug/kg/h via INTRAVENOUS
  Filled 2021-03-05 (×7): qty 100

## 2021-03-05 MED ORDER — CHLORHEXIDINE GLUCONATE CLOTH 2 % EX PADS
6.0000 | MEDICATED_PAD | Freq: Every day | CUTANEOUS | Status: DC
  Administered 2021-03-06 – 2021-03-07 (×3): 6 via TOPICAL

## 2021-03-05 MED ORDER — LORAZEPAM 2 MG/ML IJ SOLN
2.0000 mg | Freq: Once | INTRAMUSCULAR | Status: DC
  Filled 2021-03-05: qty 1

## 2021-03-05 MED ORDER — LORAZEPAM 2 MG/ML IJ SOLN
2.0000 mg | Freq: Once | INTRAMUSCULAR | Status: AC
  Administered 2021-03-05: 2 mg via INTRAVENOUS
  Filled 2021-03-05: qty 1

## 2021-03-05 MED ORDER — AMPHETAMINE-DEXTROAMPHET ER 20 MG PO CP24: 20.0000 mg | ORAL_CAPSULE | Freq: Three times a day (TID) | ORAL | Status: DC

## 2021-03-05 MED ORDER — LORAZEPAM 2 MG/ML IJ SOLN: 0.0000 mg | Freq: Four times a day (QID) | INTRAMUSCULAR | Status: DC

## 2021-03-05 MED ORDER — CHLORDIAZEPOXIDE HCL 5 MG PO CAPS
10.0000 mg | ORAL_CAPSULE | Freq: Four times a day (QID) | ORAL | Status: DC
  Administered 2021-03-05: 10 mg via ORAL
  Filled 2021-03-05: qty 2

## 2021-03-05 MED ORDER — STERILE WATER FOR INJECTION IJ SOLN
INTRAMUSCULAR | Status: AC
  Administered 2021-03-05: 1.2 mL
  Filled 2021-03-05: qty 10

## 2021-03-05 MED ORDER — THIAMINE HCL 100 MG/ML IJ SOLN
100.0000 mg | Freq: Once | INTRAMUSCULAR | Status: AC
  Administered 2021-03-05: 100 mg via INTRAVENOUS

## 2021-03-05 MED ORDER — BUPRENORPHINE HCL-NALOXONE HCL 8-2 MG SL SUBL: 1.0000 | SUBLINGUAL_TABLET | Freq: Every day | SUBLINGUAL | Status: DC

## 2021-03-05 MED ORDER — SODIUM CHLORIDE 0.9 % IV SOLN
500.0000 mg | Freq: Three times a day (TID) | INTRAVENOUS | Status: AC
  Administered 2021-03-06 (×2): 500 mg via INTRAVENOUS
  Filled 2021-03-05 (×3): qty 5

## 2021-03-05 MED ORDER — LORAZEPAM 2 MG/ML IJ SOLN: 0.0000 mg | Freq: Three times a day (TID) | INTRAMUSCULAR | Status: DC

## 2021-03-05 MED ORDER — ZIPRASIDONE MESYLATE 20 MG IM SOLR
20.0000 mg | Freq: Once | INTRAMUSCULAR | Status: AC
  Administered 2021-03-05: 20 mg via INTRAMUSCULAR
  Filled 2021-03-05: qty 20

## 2021-03-05 MED ORDER — GABAPENTIN 300 MG PO CAPS
300.0000 mg | ORAL_CAPSULE | Freq: Three times a day (TID) | ORAL | Status: DC
  Administered 2021-03-06 – 2021-03-07 (×5): 300 mg via ORAL
  Filled 2021-03-05 (×5): qty 1

## 2021-03-05 MED ORDER — THIAMINE HCL 100 MG/ML IJ SOLN: 100.0000 mg | Freq: Every day | INTRAMUSCULAR | Status: DC

## 2021-03-05 MED ORDER — LORAZEPAM 1 MG PO TABS: 0.0000 mg | ORAL_TABLET | Freq: Two times a day (BID) | ORAL | Status: DC

## 2021-03-05 MED ORDER — THIAMINE HCL 100 MG PO TABS: 100.0000 mg | ORAL_TABLET | Freq: Every day | ORAL | Status: DC

## 2021-03-05 MED ORDER — LORAZEPAM 2 MG/ML IJ SOLN
1.0000 mg | INTRAMUSCULAR | Status: DC | PRN
  Filled 2021-03-05: qty 2

## 2021-03-05 MED ORDER — THIAMINE HCL 100 MG/ML IJ SOLN
100.0000 mg | Freq: Every day | INTRAMUSCULAR | Status: DC
  Filled 2021-03-05 (×2): qty 2

## 2021-03-05 MED ORDER — LACTATED RINGERS IV SOLN
INTRAVENOUS | Status: DC
  Administered 2021-03-05: 100 mL/h via INTRAVENOUS

## 2021-03-05 MED ORDER — THIAMINE HCL 100 MG PO TABS
100.0000 mg | ORAL_TABLET | Freq: Every day | ORAL | Status: DC
  Administered 2021-03-07: 100 mg via ORAL
  Filled 2021-03-05: qty 1

## 2021-03-05 MED ORDER — FOLIC ACID 1 MG PO TABS
1.0000 mg | ORAL_TABLET | Freq: Every day | ORAL | Status: DC
  Administered 2021-03-05 – 2021-03-07 (×3): 1 mg via ORAL
  Filled 2021-03-05 (×4): qty 1

## 2021-03-05 MED ORDER — THIAMINE HCL 100 MG PO TABS
100.0000 mg | ORAL_TABLET | Freq: Every day | ORAL | Status: DC
  Administered 2021-03-05: 100 mg via ORAL
  Filled 2021-03-05: qty 1

## 2021-03-05 MED ORDER — ENOXAPARIN SODIUM 60 MG/0.6ML IJ SOSY
60.0000 mg | PREFILLED_SYRINGE | INTRAMUSCULAR | Status: DC
  Administered 2021-03-06 – 2021-03-07 (×2): 60 mg via SUBCUTANEOUS
  Filled 2021-03-05 (×2): qty 0.6

## 2021-03-05 MED ORDER — LORAZEPAM 2 MG/ML IJ SOLN: 0.0000 mg | Freq: Two times a day (BID) | INTRAMUSCULAR | Status: DC

## 2021-03-05 NOTE — ED Provider Notes (Signed)
[facility] EMERGENCY DEPARTMENT Provider Note   CSN: [id number] Arrival date & time: 03/05/21  1231     History No chief complaint on file.   Brian Hobbs is a 41 y.o. male.  Has a past medical history of alcohol and opioid use disorder. Presents the emergency department with auditory and visual hallucinations.  Patient states that he recently got off his Adderall for some reason and the new doctor that he was Prescribing it told him that they would not prescribe it unless he quit alcohol.  Patient says he quit drinking alcohol 3 days ago cold [place] and started taking his Adderall Wednesday afternoon at the prescribed dosage.  Patient states over the past 24 hours, he has been in his house has been hearing voices outside of his house.  He was going outside to check on the voices but every time he went out they could not see anybody.  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  He went out to see who was there, and he ended up seeing multiple people running through the woods away from him.  He continues to chase them at this point.  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  Patient stated that he was following his coworkers.  At this time EMS was called and patient was taken to the emergency department.  On further history of the patient, he states that he is a pretty heavy drinker and drinks about a Gatorade bottles worth of hard liquor daily.  Working for a long enough period that he does not remember how long it has been.  He states the only other time he tried to quit, he had similar hallucinations to this in the past, but he was never hospitalized.  He states that these patients that he is having is much more realistic and he is not sure if they are real or not.  He denies having any history of seizure with his alcohol  withdrawal.  Patient denies any previous psychiatric history other than ADHD or any other neurological disorder.  Patient denies any nausea, vomiting.  He does have associated tremors ever since he stopped drinking. He does endorse episodes of agitation. He states that he has not slept in three days.     HPI     Home Medications Prior to Admission medications   Medication Sig Start Date End Date Taking? Authorizing Provider  amphetamine-dextroamphetamine (ADDERALL XR) 20 MG 24 hr capsule Take 20 mg by mouth 3 (three) times daily. 03/03/21  Yes [provider]  Buprenorphine HCl-Naloxone HCl 8-2 MG FILM Place under the tongue 3 (three) times daily. 03/03/21  Yes [provider]  gabapentin (NEURONTIN) 300 MG capsule Take 300 mg by mouth 3 (three) times daily.   Yes [provider]  Iodine, Kelp, 0.15 MG TABS Take 1 tablet by mouth daily.   Yes [provider]  testosterone cypionate (DEPOTESTOSTERONE CYPIONATE) 200 MG/ML injection Inject 200 mg into the muscle every 7 (seven) days. 02/23/21  Yes [provider]  zinc gluconate 50 MG tablet Take 50 mg by mouth daily.   Yes [provider]  diphenhydrAMINE (BENADRYL) 25 mg capsule Take 1 capsule (25 mg total) by mouth every 6 (six) hours as needed for itching. Patient not taking: Reported on 03/05/2021 07/13/11 07/23/11  Jaynie Crumble, PA-C  famotidine (PEPCID) 20 MG tablet Take 1 tablet (20 mg total) by mouth 2 (two) times daily. 07/13/11 07/12/12  Kirichenko, Lemont Fillersatyana, PA-C  predniSONE (DELTASONE) 10 MG tablet Take 6 tab day 1, 5 tab day 2, 4 tab day 3, 3 tab day 4, 2 tab day 5, 1 tab day 6. Take PO Patient not taking: Reported on 03/05/2021 07/13/11   Jaynie CrumbleKirichenko, Tatyana, PA-C      Allergies    Patient has no known allergies.    Review of Systems   Review of Systems  Constitutional:  Positive for diaphoresis.  Neurological:  Positive for tremors.  Psychiatric/Behavioral:  Positive for  agitation, hallucinations and sleep disturbance. Negative for confusion and suicidal ideas. The patient is hyperactive.   All other systems reviewed and are negative.  Physical Exam Updated Vital Signs BP 136/84    Pulse (!) 133    Temp 98.2 F (36.8 C)    Resp 19    Ht 6\' 1"  (1.854 m)    Wt 120.2 kg    SpO2 94%    BMI 34.96 kg/m  Physical Exam Vitals and nursing note reviewed.  Constitutional:      General: He is not in acute distress.    Appearance: Normal appearance. He is well-developed. He is not ill-appearing, toxic-appearing or diaphoretic.  HENT:     Head: Normocephalic and atraumatic.     Nose: No nasal deformity.     Mouth/Throat:     Lips: Pink. No lesions.  Eyes:     General: Gaze aligned appropriately. No scleral icterus.       Right eye: No discharge.        Left eye: No discharge.     Conjunctiva/sclera: Conjunctivae normal.     Right eye: Right conjunctiva is not injected. No exudate or hemorrhage.    Left eye: Left conjunctiva is not injected. No exudate or hemorrhage. Pulmonary:     Effort: Pulmonary effort is normal. No respiratory distress.  Skin:    General: Skin is warm and dry.  Neurological:     Mental Status: He is alert and oriented to person, place, and time.  Psychiatric:        Attention and Perception: He perceives auditory and visual hallucinations.        Mood and Affect: Mood normal.        Speech: Speech is tangential.        Behavior: Behavior normal. Behavior is not agitated. Behavior is cooperative.        Thought Content: Thought content normal. Thought content is not paranoid or delusional. Thought content does not include homicidal or suicidal ideation. Thought content does not include homicidal or suicidal plan.    ED Results / Procedures / Treatments   Labs (all labs ordered are listed, but only abnormal results are displayed) Labs Reviewed  COMPREHENSIVE METABOLIC PANEL - Abnormal; Notable for the following components:      Result  Value   Sodium 134 (*)    All other components within normal limits  CBC WITH DIFFERENTIAL/PLATELET - Abnormal; Notable for the following components:   WBC 12.8 (*)    Neutro Abs 10.9 (*)    All other components within normal limits  SALICYLATE LEVEL - Abnormal; Notable for the following components:   Salicylate Lvl <7.0 (*)    All other components within normal limits  ACETAMINOPHEN LEVEL - Abnormal; Notable for the following components:   Acetaminophen (Tylenol), Serum <10 (*)    All other  components within normal limits  RESP PANEL BY RT-PCR (FLU A&B, COVID) ARPGX2  ETHANOL  MAGNESIUM  PHOSPHORUS  RAPID URINE DRUG SCREEN, HOSP PERFORMED  PHOSPHORUS  MAGNESIUM    EKG None  Radiology No results found.  Procedures Procedures  This patient was on telemetry or cardiac monitoring during their time in the ED.    Medications Ordered in ED Medications  thiamine (B-1) injection 100 mg ( Intravenous See Alternative 03/05/21 1552)  LORazepam (ATIVAN) tablet 1-4 mg ( Oral See Alternative 03/05/21 1936)    Or  LORazepam (ATIVAN) injection 1-4 mg (2 mg Intravenous Incomplete 03/05/21 1936)  folic acid (FOLVITE) tablet 1 mg (1 mg Oral Given 03/05/21 1843)  multivitamin with minerals tablet 1 tablet (1 tablet Oral Given 03/05/21 1843)  LORazepam (ATIVAN) injection 0-4 mg (4 mg Intravenous Given 03/05/21 1842)    Followed by  LORazepam (ATIVAN) injection 0-4 mg (has no administration in time range)  thiamine 500mg  in normal saline (24ml) IVPB (has no administration in time range)  thiamine tablet 100 mg (has no administration in time range)    Or  thiamine (B-1) injection 100 mg (has no administration in time range)  LORazepam (ATIVAN) injection 2 mg (has no administration in time range)  dexmedetomidine (PRECEDEX) 400 MCG/100ML (4 mcg/mL) infusion (has no administration in time range)  lactated ringers infusion (has no administration in time range)  LORazepam (ATIVAN) injection 2 mg (2 mg  Intravenous Given 03/05/21 1552)  LORazepam (ATIVAN) injection 2 mg (2 mg Intravenous Given 03/05/21 1803)  ziprasidone (GEODON) injection 20 mg (20 mg Intramuscular Given 03/05/21 1941)  sterile water (preservative free) injection (1.2 mLs  Given 03/05/21 1941)    ED Course/ Medical Decision Making/ A&P Clinical Course as of 03/05/21 2004  Fri Mar 05, 2021  1726 Dr. Mar 07, 2021 to admit for alcohol withdrawal with delirium [GL]    Clinical Course User Index [GL] Randol Kern Victorino Dike, PA-C                           Medical Decision Making Amount and/or Complexity of Data Reviewed Labs: ordered.  Risk OTC drugs. Prescription drug management. Decision regarding hospitalization.    MDM  This is a 41 y.o. male with a pertinent PMH of alcohol and opioid use disorder who presents to the ED with auditory and visual hallucinations in the setting of recent cessation of alcohol.  My Impression, Plan, and ED Course:  Patient is accompanied by police department.  He is tachycardic to 123.  He is afebrile and other vitals are stable. He presented after having auditory and visual hallucinations today.  This is in the setting of recently quitting alcohol 3 days ago cold 46.  He is a pretty heavy drinker and drinks approximately 1/2 Liter daily.  The only other time that he has had hallucinations like this was when he previously tried to detox from alcohol.  He has bilateral upper extremity tremors and diaphoresis on exam.  He is not really agitated, however intermittently agitated and anxious.  GI symptoms are present.  No tactile's disturbances.  No headache.  CIWA score 16.  I am concerned that this patient is having alcohol withdrawal with possible delirium tremens given his tachycardia, visual and auditory hallucinations, and increased CIWA score. Plan to get labs to rule out other medical causes of symptoms.  We will go ahead and treat patient with 2 mg of IV Ativan.  Patient will likely require  admission and monitoring.  I personally ordered, reviewed, and interpreted all laboratory work and imaging and agree with radiologist interpretation. Results interpreted below:  - CBC reveals leukocytosis to 12.8, CMP with mild hyponatremia but no evidence of acidosis, Electrolytes are normal.   On reassessment after Ativan, patient does appear to be less agitated.  He is not currently having hallucinations or delusions at this time.  I still think that given his psychosis symptoms, and elevated CIWA score on arrival, and continued tachycardia, patient will require inpatient monitoring for alcohol withdrawal.  I discussed case with Dr. Randol Kern who accepts patient for admission   Charting Requirements Additional history is obtained from:  Independent historian, PD External Records from outside source obtained and reviewed including: n/a Social Determinants of Health:  Alcoholism/Drug Addiction Pertinant PMH that complicates patient's illness: alcohol use disorder, opioid use disorder  Patient Care Problems that were addressed during this visit: - Hallucinations: Acute illness with systemic symptoms - Alcohol Withdrawal with Delirium: Acute illness with threat to life or bodily functions This patient was maintained on a cardiac monitor/telemetry. I personally viewed and interpreted the cardiac monitor which reveals an underlying rhythm of NSR Medications given in ED: Ativan Reevaluation of the patient after these medicines showed that the patient improved Disposition: Admit to [facility]  I have discussed this patient with my attending physician, Dr. Rubin Payor who has made changes to the plan accordingly.  Portions of this note were generated with Scientist, clinical (histocompatibility and immunogenetics). Dictation errors may occur despite best attempts at proofreading.    Final Clinical Impression(s) / ED Diagnoses Final diagnoses:  Alcohol withdrawal syndrome, with delirium [facility])    Rx / DC Orders ED Discharge Orders      None         Therese Sarah 03/05/21 Serita Grammes, MD 03/06/21 1527

## 2021-03-05 NOTE — H&P (Addendum)
[facility] H&P   Patient Demographics:    Brian Hobbs, is a 41 y.o. male  MRN: [id number]   DOB - 1980/05/04  Admit Date - 03/05/2021  Outpatient Primary MD for the patient is Pcp, No  Referring MD/NP/PA: PA Loffler  Patient coming from: brought by sherrif  No chief complaint on file.     HPI:    Brian Hobbs  is a 41 y.o. male, with past medical history of alcohol abuse, opioid use disorder, ADHD, vaping, history of seizure due to alcohol withdrawals in the past, he was brought to the emergency department by [facility] for auditory and visual hallucinations, patient reports he drink alcohol have pints of hard liquor per day, for last few years, reports he quit drinking 3 days ago called [place], and start taking his Adderall back on Wednesday afternoon at the prescribed dosage, as well he reports his Suboxone was recently increased to 3 times daily, -Patient report he was hearing voices outside his house, and when he went to check on the voices he saw nobody, patient was walking in the woods where he reports he saw multiple people, and somehow 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 -Patient denies any suicidal thoughts or ideations, as well he denies any homicidal thoughts or ideations. -In ED patient was noted to be tachycardic, confused, this has improved after initial Ativan, but he is back again confused and restless, [facility] hospitalist consulted to admit for alcohol withdrawals.    Review of systems:    In addition to the HPI above,  A full 10 point Review of Systems was done, except as stated above, all other Review of Systems were negative.   With Past History of the  following :    History reviewed. No pertinent past medical history.    Past Surgical History:  Procedure Laterality Date   BACK SURGERY        Social History:     Social History   Tobacco Use   Smoking status: Every Day   Smokeless tobacco: Not on file  Substance Use Topics   Alcohol use: Yes    Comment: occ        Family History :    History reviewed. No pertinent family history.    Home Medications:   Prior to Admission medications   Medication Sig Start Date End Date Taking? Authorizing Provider  amphetamine-dextroamphetamine (ADDERALL XR) 20 MG 24 hr capsule Take 20 mg by mouth 3 (three) times daily. 03/03/21  Yes [provider]  Buprenorphine HCl-Naloxone HCl 8-2 MG FILM Place under the tongue 3 (three) times daily. 03/03/21  Yes [provider]  gabapentin (NEURONTIN) 300 MG capsule Take 300 mg by mouth 3 (  three) times daily.   Yes [provider]  Iodine, Kelp, 0.15 MG TABS Take 1 tablet by mouth daily.   Yes [provider]  testosterone cypionate (DEPOTESTOSTERONE CYPIONATE) 200 MG/ML injection Inject 200 mg into the muscle every 7 (seven) days. 02/23/21  Yes [provider]  zinc gluconate 50 MG tablet Take 50 mg by mouth daily.   Yes [provider]  diphenhydrAMINE (BENADRYL) 25 mg capsule Take 1 capsule (25 mg total) by mouth every 6 (six) hours as needed for itching. Patient not taking: Reported on 03/05/2021 07/13/11 07/23/11  Jaynie Crumble, PA-C  famotidine (PEPCID) 20 MG tablet Take 1 tablet (20 mg total) by mouth 2 (two) times daily. 07/13/11 07/12/12  Kirichenko, Lemont Fillers, PA-C  predniSONE (DELTASONE) 10 MG tablet Take 6 tab day 1, 5 tab day 2, 4 tab day 3, 3 tab day 4, 2 tab day 5, 1 tab day 6. Take PO Patient not taking: Reported on 03/05/2021 07/13/11   Jaynie Crumble, PA-C     Allergies:    No Known Allergies   Physical Exam:   Vitals  Blood pressure 139/76, pulse (!) 116, temperature  98.2 F (36.8 C), resp. rate 17, height 6\' 1"  (1.854 m), weight 120.2 kg, SpO2 96 %.   1. General developed male, sitting in bed, restless, anxious.  With tremors  2.  Impaired judgment and insight, awake, oriented x2,  3.  no F.N deficits, ALL C.Nerves Intact, Strength 5/5 all 4 extremities, Sensation intact all 4 extremities, Plantars down going.  4. Ears and Eyes appear Normal, Conjunctivae clear, PERRLA. Moist Oral Mucosa.  5. Supple Neck, No JVD, No cervical lymphadenopathy appriciated, No Carotid Bruits.  6. Symmetrical Chest wall movement, Good air movement bilaterally, CTAB.  7.  Tachycardic, No Gallops, Rubs or Murmurs, No Parasternal Heave.  8. Positive Bowel Sounds, Abdomen Soft, No tenderness, No organomegaly appriciated,No rebound -guarding or rigidity.  9.  No Cyanosis, Normal Skin Turgor, No Skin Rash or Bruise.  10. Good muscle tone,  joints appear normal , no effusions, Normal ROM.  11. No Palpable Lymph Nodes in Neck or Axillae    Data Review:    CBC Recent Labs  Lab 03/05/21 1519  WBC 12.8*  HGB 14.0  HCT 41.9  PLT 370  MCV 94.6  MCH 31.6  MCHC 33.4  RDW 13.1  LYMPHSABS 1.2  MONOABS 0.5  EOSABS 0.1  BASOSABS 0.1   ------------------------------------------------------------------------------------------------------------------  Chemistries  Recent Labs  Lab 03/05/21 1519  NA 134*  K 4.3  CL 99  CO2 25  GLUCOSE 94  BUN 9  CREATININE 0.77  CALCIUM 9.0  MG 2.0  AST 37  ALT 24  ALKPHOS 57  BILITOT 0.6   ------------------------------------------------------------------------------------------------------------------ estimated creatinine clearance is 165 mL/min (by C-G formula based on SCr of 0.77 mg/dL). ------------------------------------------------------------------------------------------------------------------ No results for input(s): TSH, T4TOTAL, T3FREE, THYROIDAB in the last 72 hours.  Invalid input(s):  FREET3  Coagulation profile No results for input(s): INR, PROTIME in the last 168 hours. ------------------------------------------------------------------------------------------------------------------- No results for input(s): DDIMER in the last 72 hours. -------------------------------------------------------------------------------------------------------------------  Cardiac Enzymes No results for input(s): CKMB, TROPONINI, MYOGLOBIN in the last 168 hours.  Invalid input(s): CK ------------------------------------------------------------------------------------------------------------------ No results found for: BNP   ---------------------------------------------------------------------------------------------------------------  Urinalysis    Component Value Date/Time   COLORURINE YELLOW 06/19/2007 0958   APPEARANCEUR CLEAR 06/19/2007 0958   LABSPEC >1.030 (H) 06/19/2007 0958   PHURINE 5.5 06/19/2007 0958   GLUCOSEU NEGATIVE 06/19/2007 0958   HGBUR  TRACE (A) 06/19/2007 0958   BILIRUBINUR NEGATIVE 06/19/2007 0958   KETONESUR NEGATIVE 06/19/2007 0958   PROTEINUR NEGATIVE 06/19/2007 0958   UROBILINOGEN 0.2 06/19/2007 0958   NITRITE NEGATIVE 06/19/2007 0958   LEUKOCYTESUR NEGATIVE 06/19/2007 0958    ----------------------------------------------------------------------------------------------------------------   Imaging Results:    No results found.    Assessment & Plan:    Principal Problem:   Alcohol withdrawal (HCC) Active Problems:   Alcohol abuse   Opioid use disorder   ADHD   Alcohol withdrawals with DTs/alcohol abuse -Last drink was 3 days ago, patient presents with confusion, hallucinations and delirium -He will be admitted to stepdown under CIWA stepdown protocol, restless agitated initially improved with some Ativan, but now restless again, will start on scheduled Librium, and CIWA Ativan protocol -We will continue with high-dose thiamine for next 48  hours -Continue with folic acid  Opioid use disorder -Continue with Suboxone  ADHD -Continue with Adderall   Addendum at 7:55 PM -Patient is extremely combative, restless, pulled 3 IV so far, he appears to be in severe alcohol withdrawals with DTs, restless despite receiving 9 mg of IV Ativan, he already received 20 mg of IM Geodon, and he will be admitted to ICU for Precedex drip.  DVT Prophylaxis   Lovenox   AM Labs Ordered, also please review Full Orders  Family Communication: Admission, patients condition and plan of care including tests being ordered have been discussed with the patient and mother by phone  who indicate understanding and agree with the plan and Code Status.  Code Status Full  Likely DC to  home  Condition GUARDED    Consults called: none    Admission status: inpatient    Time spent in minutes : 70 minutes   Huey Bienenstock M.D on 03/05/2021 at 5:55 PM   [facility] - Office  [phone]

## 2021-03-05 NOTE — ED Notes (Signed)
Pt pulled out another IV. MD aware.  ?

## 2021-03-05 NOTE — ED Notes (Signed)
Pts valuables envelope receipt [id number] was sent with the pt to ICU room 5, pts IVC papers sent with the pt to ICU  ?

## 2021-03-05 NOTE — ED Provider Notes (Signed)
7:10 PM-patient has removed multiple IVs, is agitated, and does not have capacity to choose to leave.  Currently law enforcement at the bedside but he is not in any legal jeopardy.  Patient will be restrained, sedated, and I have placed him under involuntary commitment. ?  ?Mancel Bale, MD ?03/05/21 1913 ? ?

## 2021-03-05 NOTE — ED Notes (Signed)
[employer] at bedside, pt in 3 point law enforcement restraints, pt disoriented x 4, pt reassured with continuous verbal redirection, safety sitter at bedside, Admitting MD at bedside at 19:20 with orders placed for Precedex, IV placed in R hand and secured, pt placed on monitor with continuous removal of EKG leads, pt removes all monitoring devices, will continue to monitor ?

## 2021-03-05 NOTE — ED Notes (Signed)
MD at bedside, aware of RASS score, verbal order that pt is to remain at current level of sedation at this time, will continue to monitor ?

## 2021-03-05 NOTE — ED Notes (Signed)
Pt removed IV. Remains agitated.  ?

## 2021-03-05 NOTE — ED Notes (Signed)
Pt removed from 3 point restraints, and sheriffs signed off on ?

## 2021-03-05 NOTE — ED Notes (Signed)
MD notified re: RASS -2 ?

## 2021-03-05 NOTE — ED Notes (Signed)
Pt placed on 2 L [place]. ? RN notified O2 88-90% after medicated and prexadex started  ?
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
## 2021-03-05 NOTE — ED Notes (Signed)
Per Dr. Randol Kern, give pt additional 4mg  IV ativan.  ?
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
## 2021-03-06 DIAGNOSIS — F1190 Opioid use, unspecified, uncomplicated: Secondary | ICD-10-CM

## 2021-03-06 DIAGNOSIS — Z6834 Body mass index (BMI) 34.0-34.9, adult: Secondary | ICD-10-CM

## 2021-03-06 DIAGNOSIS — E6609 Other obesity due to excess calories: Secondary | ICD-10-CM

## 2021-03-06 DIAGNOSIS — F1010 Alcohol abuse, uncomplicated: Secondary | ICD-10-CM

## 2021-03-06 LAB — COMPREHENSIVE METABOLIC PANEL
ALT: 23 U/L (ref 0–44)
AST: 36 U/L (ref 15–41)
Albumin: 3.6 g/dL (ref 3.5–5.0)
Alkaline Phosphatase: 46 U/L (ref 38–126)
Anion gap: 9 (ref 5–15)
BUN: 8 mg/dL (ref 6–20)
CO2: 25 mmol/L (ref 22–32)
Calcium: 8.6 mg/dL — ABNORMAL LOW (ref 8.9–10.3)
Chloride: 102 mmol/L (ref 98–111)
Creatinine, Ser: 0.68 mg/dL (ref 0.61–1.24)
GFR, Estimated: >60 mL/min (ref >60)
Glucose, Bld: 79 mg/dL (ref 70–99)
Potassium: 4.2 mmol/L (ref 3.5–5.1)
Sodium: 136 mmol/L (ref 135–145)
Total Bilirubin: 0.7 mg/dL (ref 0.3–1.2)
Total Protein: 6.7 g/dL (ref 6.5–8.1)

## 2021-03-06 LAB — MAGNESIUM: Magnesium: 2.3 mg/dL (ref 1.7–2.4)

## 2021-03-06 LAB — CBC
HCT: 40.3 % (ref 39.0–52.0)
Hemoglobin: 13.5 g/dL (ref 13.0–17.0)
MCH: 32.4 pg (ref 26.0–34.0)
MCHC: 33.5 g/dL (ref 30.0–36.0)
MCV: 96.6 fL (ref 80.0–100.0)
Platelets: 320 10*3/uL (ref 150–400)
RBC: 4.17 MIL/uL — ABNORMAL LOW (ref 4.22–5.81)
RDW: 13.4 % (ref 11.5–15.5)
WBC: 7.7 10*3/uL (ref 4.0–10.5)
nRBC: 0 % (ref 0.0–0.2)

## 2021-03-06 LAB — MRSA NEXT GEN BY PCR, NASAL: MRSA by PCR Next Gen: NOT DETECTED

## 2021-03-06 LAB — HIV ANTIBODY (ROUTINE TESTING W REFLEX): HIV Screen 4th Generation wRfx: NONREACTIVE

## 2021-03-06 LAB — PHOSPHORUS: Phosphorus: 4.3 mg/dL (ref 2.5–4.6)

## 2021-03-06 MED ORDER — BUPRENORPHINE HCL-NALOXONE HCL 8-2 MG SL SUBL
1.0000 | SUBLINGUAL_TABLET | Freq: Two times a day (BID) | SUBLINGUAL | Status: DC
  Administered 2021-03-06 – 2021-03-07 (×3): 1 via SUBLINGUAL
  Filled 2021-03-06 (×2): qty 1
  Filled 2021-03-06: qty 2
  Filled 2021-03-06: qty 1

## 2021-03-06 MED ORDER — THIAMINE HCL 100 MG/ML IJ SOLN
INTRAMUSCULAR | Status: AC
  Filled 2021-03-06: qty 6

## 2021-03-06 MED ORDER — PANTOPRAZOLE SODIUM 40 MG PO TBEC
40.0000 mg | DELAYED_RELEASE_TABLET | Freq: Every day | ORAL | Status: DC
  Administered 2021-03-06 – 2021-03-07 (×2): 40 mg via ORAL
  Filled 2021-03-06 (×2): qty 1

## 2021-03-06 NOTE — Assessment & Plan Note (Addendum)
-  With prior history of alcohol withdrawal and ongoing abuse. ?-Cessation counseling provided ?-CIWA score down to 3 ?-Still receiving Precedex; will initiate treatment with Librium and oriented to wean off. ?-Discontinue restraints and follow response. ?-There is no agitation, combativeness, auditory or visual hallucinations currently. ?-Continue supportive care ?-Continue thiamine and folic acid. ?

## 2021-03-06 NOTE — Assessment & Plan Note (Addendum)
-  Patient has completed high-dose thiamine for potential presence of Warnicke; mentation has improved and following commands appropriately ?-Continue daily thiamine and folic acid supplementation ?-Continue weaning of Precedex drip and continue treatment with Librium. ? ? ? ?

## 2021-03-06 NOTE — Assessment & Plan Note (Signed)
-  Continue the use of Suboxone and continue outpatient pain clinic management ?

## 2021-03-06 NOTE — Assessment & Plan Note (Addendum)
-  Cessation counseling provided ?-Continue to maintain adequate hydration ?-Continue treatment with Precedex, thiamine folic acid and Librium initiation. ?-Outpatient community resources will be provided at discharge to assist patient with quitting process. ?

## 2021-03-06 NOTE — Progress Notes (Signed)
?  Progress Note ? ? ?Patient: Brian Hobbs [id number] DOB: Nov 02, 1980 DOA: 03/05/2021     1 ?DOS: the patient was seen and examined on 03/06/2021 ?  ?Brief hospital admission narrative course: ?41 year old with past medical history significant for alcohol abuse, opioid disorder, ADHD, prior history of seizure associated with alcohol withdrawal in the past and class I obesity; presented to the hospital secondary to auditory and visual hallucinations.  Patient reports last alcohol intake about 3 days prior to admission.  He has decided to quit cold [place].  For further details please refer to H&P written by Dr. Randol Kern on 03/05/2021. ? ?Assessment and Plan: ?* Alcohol withdrawal (HCC) ?-With prior history of alcohol withdrawal and ongoing abuse. ?-Cessation counseling provided ?-Actively experiencing DTs and is still with significant elevated CIWA. ?-Continue the use of Precedex and continue supportive care. ? ?Alcohol abuse ?-Cessation counseling provided ?-Continue to maintain adequate hydration ?-Continue treatment with Precedex, thiamine and folic acid ?-We will ask TOC to provide outpatient resources to assist patient with quitting process. ? ?Opioid use disorder ?-Continue the use of Suboxone and continue outpatient pain clinic management ? ?ADHD ?-Planning to resume home ADHD medications at time of discharge ?-Currently receiving treatment for alcohol withdrawal/DTs with benzodiazepines and Precedex. ? ?Severe alcohol withdrawal delirium (HCC) ?-Continue treatment with Precedex infusion, high-dose thiamine and folic acid. ?-Follow clinical response. ?-Once stable planning to discharge home on Librium tapering. ? ?Class I obesity: ?-Body mass index is 34.96 kg/m?. ?-Low calorie diet, portion control and increase physical activity discussed with patient. ? ?GI prophylaxis: ?-Continue PPI. ? ?Subjective:  ?No chest pain, no nausea vomiting.  Still tangential intermittently disoriented and with elevated CIWA  score.  Using Precedex and requiring restraints. ? ?Physical Exam: ?Vitals:  ? 03/06/21 0500 03/06/21 0600 03/06/21 0828 03/06/21 1212  ?BP: 119/72 127/75    ?Pulse: 79 90    ?Resp: 14 14    ?Temp:   (!) 97.5 ?F (36.4 ?C) (!) 97.3 ?F (36.3 ?C)  ?TempSrc:   Oral Axillary  ?SpO2: 94% 98%    ?Weight:      ?Height:      ? ?General exam: Alert, awake, oriented x 2 (but intermittently disoriented).  Poor insight process.  Afebrile, no chest pain, no nausea or vomiting. ?Respiratory system: Clear to auscultation. Respiratory effort normal.  No using accessory muscle. ?Cardiovascular system: Sinus tachycardia, no rubs, no gallops, no JVD. ?Gastrointestinal system: Abdomen is nondistended, soft and nontender. No organomegaly or masses felt. Normal bowel sounds heard. ?Central nervous system: Alert and oriented. No focal neurological deficits. ?Extremities: No cyanosis or clubbing. ?Skin: No rashes, lesions or ulcers ?Psychiatry: Judgement and insight appear normal. Mood & affect appropriate.  ? ? ?Data Reviewed: ?-HIV antibody negative ?-Normal sodium, potassium and and creatinine.   ?-Patient's phosphorus and magnesium also within normal limits. ? ?Family Communication: No family at bedside. ? ?Disposition: ?Status is: Inpatient ?Remains inpatient appropriate because: Experiencing active severe withdrawal symptoms from alcohol and in need of Precedex infusion. ? ? ?Planned Discharge Destination: Home ? ? ?Author: ?Vassie Loll, MD ?03/06/2021 5:35 PM ? ?For on call review [email or web address].  ? ?

## 2021-03-06 NOTE — Assessment & Plan Note (Addendum)
-  Planning to resume home ADHD medications at time of discharge ?-Currently receiving treatment for alcohol withdrawal/DTs with Librium and Precedex. ?

## 2021-03-07 MED ORDER — NICOTINE 21 MG/24HR TD PT24
21.0000 mg | MEDICATED_PATCH | Freq: Every day | TRANSDERMAL | Status: DC
Start: 2021-03-07 — End: 2021-03-08
  Administered 2021-03-07: 21 mg via TRANSDERMAL
  Filled 2021-03-07: qty 1

## 2021-03-07 MED ORDER — CHLORDIAZEPOXIDE HCL 5 MG PO CAPS
10.0000 mg | ORAL_CAPSULE | Freq: Three times a day (TID) | ORAL | Status: DC
  Administered 2021-03-07: 10 mg via ORAL
  Filled 2021-03-07: qty 2

## 2021-03-07 NOTE — Progress Notes (Signed)
?Progress Note ? ? ?Patient: Brian Hobbs [id number] DOB: 1981/01/02 DOA: 03/05/2021     2 ?DOS: the patient was seen and examined on 03/07/2021 ?  ?Brief hospital admission narrative course: ?41 year old with past medical history significant for alcohol abuse, opioid disorder, ADHD, prior history of seizure associated with alcohol withdrawal in the past and class I obesity; presented to the hospital secondary to auditory and visual hallucinations.  Patient reports last alcohol intake about 3 days prior to admission.  He has decided to quit cold [place].  For further details please refer to H&P written by Dr. Waldron Labs on 03/05/2021. ? ?Assessment and Plan: ?* Alcohol withdrawal ([place]) ?-With prior history of alcohol withdrawal and ongoing abuse. ?-Cessation counseling provided ?-CIWA score down to 3 ?-Still receiving Precedex; will initiate treatment with Librium and oriented to wean off. ?-Discontinue restraints and follow response. ?-There is no agitation, combativeness, auditory or visual hallucinations currently. ?-Continue supportive care ?-Continue thiamine and folic acid. ? ?Alcohol abuse ?-Cessation counseling provided ?-Continue to maintain adequate hydration ?-Continue treatment with Precedex, thiamine folic acid and Librium initiation. ?-Outpatient community resources will be provided at discharge to assist patient with quitting process. ? ?Opioid use disorder ?-Continue the use of Suboxone and continue outpatient pain clinic management ? ?ADHD ?-Planning to resume home ADHD medications at time of discharge ?-Currently receiving treatment for alcohol withdrawal/DTs with Librium and Precedex. ? ?Severe alcohol withdrawal delirium ([place]) ?-Patient has completed high-dose thiamine for potential presence of Warnicke; mentation has improved and following commands appropriately ?-Continue daily thiamine and folic acid supplementation ?-Continue weaning of Precedex drip and continue treatment with  Librium. ? ? ? ? ?Class I obesity: ?-Body mass index is 37 kg/m?. ?-Low calorie diet, portion control and increase physical activity discussed with patient. ? ?GI prophylaxis: ?-We will continue using PPI. ? ?Subjective:  ?Afebrile, no chest pain, no nausea, no vomiting.  Demonstrating improvement in his orientation and mood instability.  Denies suicidal ideation or hallucination at this time.  Patient is following commands appropriate.  Still receiving Precedex drip. ? ?Physical Exam: ?Vitals:  ? 03/07/21 0800 03/07/21 1213 03/07/21 1300 03/07/21 1500  ?BP: (!) 115/94     ?Pulse: 100 71 87 95  ?Resp: (!) 26 13  14   ?Temp: 97.8 ?F (36.6 ?C) 98 ?F (36.7 ?C)    ?TempSrc: Axillary Axillary    ?SpO2: 94% 93% 93% 93%  ?Weight:      ?Height:      ? ? ?General exam: Alert, awake, oriented x 3; throughout the night experiencing intermittent episodes of disorientation.  There is no agitation of restlessness throughout examination today.  Following commands appropriately. ?Respiratory system: Clear to auscultation. Respiratory effort normal.  Good saturation on room air; no using accessory muscles. ?Cardiovascular system:RRR. No murmurs, rubs, gallops.  No JVD ?Gastrointestinal system: Abdomen is obese, nondistended, soft and nontender. No organomegaly or masses felt. Normal bowel sounds heard. ?Central nervous system: Alert and oriented. No focal neurological deficits. ?Extremities: No cyanosis or clubbing; no edema. ?Skin: No rashes, no petechiae. ?Psychiatry: Judgement and insight appear normal. Mood & affect appropriate currently.  ? ?Data Reviewed: ?-HIV antibody negative ?-Normal sodium, potassium and and creatinine.   ?-Patient's phosphorus and magnesium has also remain within normal limits. ? ?Family Communication: No family at bedside. ? ?Disposition: ?Status is: Inpatient ?Remains inpatient appropriate because: Experiencing active severe withdrawal symptoms from alcohol and in need of Precedex infusion. ? ? ?Planned  Discharge Destination: Home ? ? ?Author: ?Barton Dubois, MD ?03/07/2021  3:48 PM ? ?For on call review [email or web address].  ? ?

## 2021-03-07 NOTE — ED Notes (Signed)
Rescinding IVC paperwork faxed to the magistrate.  ?

## 2021-03-07 NOTE — Progress Notes (Signed)
Patient has requested to leave hospital AMA. On assessment patient is A&O x 4 and able to make his own decisions. Patient's belongings returned to him and paper work was signed. He was able to find ride and security available to escort him to proper exit.  Zierle-Ghosh MD notified.  ?

## 2021-03-08 NOTE — Discharge Summary (Signed)
Physician Discharge Summary   Patient: Brian Hobbs MRN: [id number] DOB: 1980/04/27  Admit date:     03/05/2021  Discharge date: 03/07/21  Discharge Physician: Barton Dubois   PCP: Pcp, No   Recommendations at discharge:  Patient left [facility].  Discharge Diagnoses: Principal Problem:   Alcohol withdrawal ([place]) Active Problems:   Alcohol abuse   Opioid use disorder   ADHD   Severe alcohol withdrawal delirium ([place])  Brief hospital admission narrative course: 41 year old with past medical history significant for alcohol abuse, opioid disorder, ADHD, prior history of seizure associated with alcohol withdrawal in the past and class I obesity; presented to the hospital secondary to auditory and visual hallucinations.  Patient reports last alcohol intake about 3 days prior to admission.  He has decided to quit cold [place].  For further details please refer to H&P written by Dr. Waldron Labs on 03/05/2021.  Assessment and Plan: * Alcohol withdrawal ([place]) -With prior history of alcohol withdrawal and ongoing abuse. -Cessation counseling provided -CIWA score down to 3; last evaluated oriented x3 able to make his own decisions. -Involuntary commitment was discontinue at that point. -Still receiving Precedex; will initiate treatment with Librium and oriented to wean off. -Discontinue restraints and follow response. -There is no agitation, combativeness, auditory or visual hallucinations currently. -Continue supportive care -Continue thiamine and folic acid. -Patient left AGAINST MEDICAL ADVICE.  Alcohol abuse -Cessation counseling provided -Continue to maintain adequate hydration -Continue treatment with Precedex, thiamine folic acid and Librium initiation. -Outpatient community resources will be provided at discharge to assist patient with quitting process.  Opioid use disorder -Continue the use of Suboxone and continue outpatient pain clinic  management  ADHD -Planning to resume home ADHD medications at time of discharge -Currently receiving treatment for alcohol withdrawal/DTs with Librium and Precedex.  Severe alcohol withdrawal delirium ([place]) -Patient has completed high-dose thiamine for potential presence of Warnicke; mentation has improved and following commands appropriately -Continue daily thiamine and folic acid supplementation -Continue weaning of Precedex drip and continue treatment with Librium.   Consultants: None Procedures performed: See below for x-ray reports. Disposition: Patient left AGAINST MEDICAL ADVICE. Diet recommendation:  While inpatient we discussed the importance of heart healthy and low calorie diet; patient left AGAINST MEDICAL ADVICE and no instructions for discharge able to be provided.  DISCHARGE MEDICATION: Patient left AGAINST MEDICAL ADVICE and no medications were able to be prescribed.   Discharge Exam: Filed Weights   03/05/21 1406 03/07/21 0534  Weight: 120.2 kg 127.2 kg   General exam: Alert, awake, oriented x 3; throughout the night experiencing intermittent episodes of disorientation.  There is no agitation of restlessness throughout examination today.  Following commands appropriately. Respiratory system: Clear to auscultation. Respiratory effort normal.  Good saturation on room air; no using accessory muscles. Cardiovascular system:RRR. No murmurs, rubs, gallops.  No JVD Gastrointestinal system: Abdomen is obese, nondistended, soft and nontender. No organomegaly or masses felt. Normal bowel sounds heard. Central nervous system: Alert and oriented. No focal neurological deficits. Extremities: No cyanosis or clubbing; no edema. Skin: No rashes, no petechiae. Psychiatry: Judgement and insight appear normal. Mood & affect appropriate currently.     Condition at discharge: Per nursing report patient was alert, awake and oriented x4; hemodynamically stable and left the hospital  [place] to go home.  The results of significant diagnostics from this hospitalization (including imaging, microbiology, ancillary and laboratory) are listed below for reference.   Imaging Studies: No results found.  Microbiology: Results  for orders placed or performed during the hospital encounter of 03/05/21  Resp Panel by RT-PCR (Flu A&B, Covid) Nasopharyngeal Swab     Status: None   Collection Time: 03/05/21  9:00 PM   Specimen: Nasopharyngeal Swab; Nasopharyngeal(NP) swabs in vial transport medium  Result Value Ref Range Status   SARS Coronavirus 2 by RT PCR NEGATIVE NEGATIVE Final    Comment: (NOTE) SARS-CoV-2 target nucleic acids are NOT DETECTED.  The SARS-CoV-2 RNA is generally detectable in upper respiratory specimens during the acute phase of infection. The lowest concentration of SARS-CoV-2 viral copies this assay can detect is 138 copies/mL. A negative result does not preclude SARS-Cov-2 infection and should not be used as the sole basis for treatment or other patient management decisions. A negative result may occur with  improper specimen collection/handling, submission of specimen other than nasopharyngeal swab, presence of viral mutation(s) within the areas targeted by this assay, and inadequate number of viral copies(<138 copies/mL). A negative result must be combined with clinical observations, patient history, and epidemiological information. The expected result is Negative.  Fact Sheet for Patients:  BloggerCourse.com  Fact Sheet for Healthcare Providers:  SeriousBroker.it  This test is no t yet approved or cleared by the [place] FDA and  has been authorized for detection and/or diagnosis of SARS-CoV-2 by FDA under an Emergency Use Authorization (EUA). This EUA will remain  in effect (meaning this test can be used) for the duration of the COVID-19 declaration under Section 564(b)(1) of  the Act, 21 U.S.C.section 360bbb-3(b)(1), unless the authorization is terminated  or revoked sooner.       Influenza A by PCR NEGATIVE NEGATIVE Final   Influenza B by PCR NEGATIVE NEGATIVE Final    Comment: (NOTE) The Xpert Xpress SARS-CoV-2/FLU/RSV plus assay is intended as an aid in the diagnosis of influenza from Nasopharyngeal swab specimens and should not be used as a sole basis for treatment. Nasal washings and aspirates are unacceptable for Xpert Xpress SARS-CoV-2/FLU/RSV testing.  Fact Sheet for Patients: BloggerCourse.com  Fact Sheet for Healthcare Providers: SeriousBroker.it  This test is not yet approved or cleared by the [place] FDA and has been authorized for detection and/or diagnosis of SARS-CoV-2 by FDA under an Emergency Use Authorization (EUA). This EUA will remain in effect (meaning this test can be used) for the duration of the COVID-19 declaration under Section 564(b)(1) of the Act, 21 U.S.C. section 360bbb-3(b)(1), unless the authorization is terminated or revoked.  Performed at [facility], [address] [place], [place] [address]   MRSA Next Gen by PCR, Nasal     Status: None   Collection Time: 03/05/21 11:04 PM   Specimen: Nasal Mucosa; Nasal Swab  Result Value Ref Range Status   MRSA by PCR Next Gen NOT DETECTED NOT DETECTED Final    Comment: (NOTE) The GeneXpert MRSA Assay (FDA approved for NASAL specimens only), is one component of a comprehensive MRSA colonization surveillance program. It is not intended to diagnose MRSA infection nor to guide or monitor treatment for MRSA infections. Test performance is not FDA approved in patients less than 4 years old. Performed at [facility], [address], [place], [place] [address]     Labs: CBC: Recent Labs  Lab 03/05/21 1519 03/06/21 0411  WBC 12.8* 7.7  NEUTROABS 10.9*  --   HGB 14.0 13.5  HCT 41.9 40.3  MCV 94.6 96.6  PLT 370 320    Basic Metabolic Panel: Recent Labs  Lab 03/05/21 1519 03/06/21 0411  NA 134* 136  K 4.3 4.2  CL 99 102  CO2 25 25  GLUCOSE 94 79  BUN 9 8  CREATININE 0.77 0.68  CALCIUM 9.0 8.6*  MG 2.0 2.3  PHOS 2.9 4.3   Liver Function Tests: Recent Labs  Lab 03/05/21 1519 03/06/21 0411  AST 37 36  ALT 24 23  ALKPHOS 57 46  BILITOT 0.6 0.7  PROT 7.5 6.7  ALBUMIN 4.1 3.6    Discharge time spent: less than 30 minutes.  Signed: Barton Dubois, MD [facility] 03/08/2021

## 2022-01-05 DIAGNOSIS — F1120 Opioid dependence, uncomplicated: Secondary | ICD-10-CM | POA: Diagnosis not present

## 2022-01-06 DIAGNOSIS — Z79899 Other long term (current) drug therapy: Secondary | ICD-10-CM | POA: Diagnosis not present

## 2022-02-01 DIAGNOSIS — F1120 Opioid dependence, uncomplicated: Secondary | ICD-10-CM | POA: Diagnosis not present

## 2022-02-02 DIAGNOSIS — F1120 Opioid dependence, uncomplicated: Secondary | ICD-10-CM | POA: Diagnosis not present

## 2022-03-01 DIAGNOSIS — F1120 Opioid dependence, uncomplicated: Secondary | ICD-10-CM | POA: Diagnosis not present

## 2022-03-02 DIAGNOSIS — F1120 Opioid dependence, uncomplicated: Secondary | ICD-10-CM | POA: Diagnosis not present

## 2022-03-28 DIAGNOSIS — F1120 Opioid dependence, uncomplicated: Secondary | ICD-10-CM | POA: Diagnosis not present

## 2022-03-30 DIAGNOSIS — F1120 Opioid dependence, uncomplicated: Secondary | ICD-10-CM | POA: Diagnosis not present

## 2022-04-13 DIAGNOSIS — R29898 Other symptoms and signs involving the musculoskeletal system: Secondary | ICD-10-CM | POA: Diagnosis not present

## 2022-04-13 DIAGNOSIS — E349 Endocrine disorder, unspecified: Secondary | ICD-10-CM | POA: Diagnosis not present

## 2022-04-13 DIAGNOSIS — I10 Essential (primary) hypertension: Secondary | ICD-10-CM | POA: Diagnosis not present

## 2022-04-13 DIAGNOSIS — N529 Male erectile dysfunction, unspecified: Secondary | ICD-10-CM | POA: Diagnosis not present

## 2022-04-20 DIAGNOSIS — R200 Anesthesia of skin: Secondary | ICD-10-CM | POA: Diagnosis not present

## 2022-04-20 DIAGNOSIS — R29898 Other symptoms and signs involving the musculoskeletal system: Secondary | ICD-10-CM | POA: Diagnosis not present

## 2022-04-20 DIAGNOSIS — I10 Essential (primary) hypertension: Secondary | ICD-10-CM | POA: Diagnosis not present

## 2022-04-21 ENCOUNTER — Telehealth: Payer: Self-pay

## 2022-04-21 NOTE — Telephone Encounter (Signed)
Called [facility] to inform them that we cannot see patient and [facility] in [place] would be a better fit for him. LVM to inform the nurses

## 2022-04-21 NOTE — Telephone Encounter (Signed)
-----   Message from Juanda Chance, NP sent at 04/21/2022  2:20 PM EDT ----- We have reviewed attached referral. We feel patient would be better served with [facility] in [place] as this facility is more of comprehensive pain center. Thanks ----- Message ----- From: Sharlet Salina, CMA Sent: 04/21/2022   2:06 PM EDT To: Tyrell Antonio, MD; Juanda Chance, NP   ----- Message ----- From: Dorcas Mcmurray Sent: 04/21/2022   2:05 PM EDT To: Sharlet Salina, CMA

## 2022-04-22 ENCOUNTER — Telehealth: Payer: Self-pay | Admitting: Physical Medicine and Rehabilitation

## 2022-04-22 NOTE — Telephone Encounter (Signed)
Patient called asked if his referral has been received. The number to contact patient is [phone]

## 2022-04-25 NOTE — Telephone Encounter (Signed)
Spoke with patient and informed him that Dr. Alvester Morin suggested going to [facility] per referral

## 2022-04-26 DIAGNOSIS — Z79899 Other long term (current) drug therapy: Secondary | ICD-10-CM | POA: Diagnosis not present

## 2022-04-27 DIAGNOSIS — F1120 Opioid dependence, uncomplicated: Secondary | ICD-10-CM | POA: Diagnosis not present

## 2022-04-28 DIAGNOSIS — R29898 Other symptoms and signs involving the musculoskeletal system: Secondary | ICD-10-CM | POA: Diagnosis not present

## 2022-04-28 DIAGNOSIS — M5135 Other intervertebral disc degeneration, thoracolumbar region: Secondary | ICD-10-CM | POA: Diagnosis not present

## 2022-04-28 DIAGNOSIS — M5136 Other intervertebral disc degeneration, lumbar region: Secondary | ICD-10-CM | POA: Diagnosis not present

## 2022-04-28 DIAGNOSIS — M5137 Other intervertebral disc degeneration, lumbosacral region: Secondary | ICD-10-CM | POA: Diagnosis not present

## 2022-04-28 DIAGNOSIS — M79605 Pain in left leg: Secondary | ICD-10-CM | POA: Diagnosis not present

## 2022-04-28 DIAGNOSIS — M47816 Spondylosis without myelopathy or radiculopathy, lumbar region: Secondary | ICD-10-CM | POA: Diagnosis not present

## 2022-04-28 DIAGNOSIS — Z79899 Other long term (current) drug therapy: Secondary | ICD-10-CM | POA: Diagnosis not present

## 2022-04-28 DIAGNOSIS — M549 Dorsalgia, unspecified: Secondary | ICD-10-CM | POA: Diagnosis not present

## 2022-04-28 DIAGNOSIS — M4184 Other forms of scoliosis, thoracic region: Secondary | ICD-10-CM | POA: Diagnosis not present

## 2022-04-28 DIAGNOSIS — M25552 Pain in left hip: Secondary | ICD-10-CM | POA: Diagnosis not present

## 2022-04-28 DIAGNOSIS — M47814 Spondylosis without myelopathy or radiculopathy, thoracic region: Secondary | ICD-10-CM | POA: Diagnosis not present

## 2022-04-28 DIAGNOSIS — G8921 Chronic pain due to trauma: Secondary | ICD-10-CM | POA: Diagnosis not present

## 2022-04-28 DIAGNOSIS — M419 Scoliosis, unspecified: Secondary | ICD-10-CM | POA: Diagnosis not present

## 2022-04-28 DIAGNOSIS — Z5181 Encounter for therapeutic drug level monitoring: Secondary | ICD-10-CM | POA: Diagnosis not present

## 2022-05-11 DIAGNOSIS — I10 Essential (primary) hypertension: Secondary | ICD-10-CM | POA: Diagnosis not present

## 2022-05-11 DIAGNOSIS — R29898 Other symptoms and signs involving the musculoskeletal system: Secondary | ICD-10-CM | POA: Diagnosis not present

## 2022-05-11 DIAGNOSIS — N529 Male erectile dysfunction, unspecified: Secondary | ICD-10-CM | POA: Diagnosis not present

## 2022-05-11 DIAGNOSIS — J069 Acute upper respiratory infection, unspecified: Secondary | ICD-10-CM | POA: Diagnosis not present

## 2022-05-13 DIAGNOSIS — M5136 Other intervertebral disc degeneration, lumbar region: Secondary | ICD-10-CM | POA: Diagnosis not present

## 2022-05-13 DIAGNOSIS — M48061 Spinal stenosis, lumbar region without neurogenic claudication: Secondary | ICD-10-CM | POA: Diagnosis not present

## 2022-05-13 DIAGNOSIS — M5137 Other intervertebral disc degeneration, lumbosacral region: Secondary | ICD-10-CM | POA: Diagnosis not present

## 2022-05-13 DIAGNOSIS — M47816 Spondylosis without myelopathy or radiculopathy, lumbar region: Secondary | ICD-10-CM | POA: Diagnosis not present

## 2022-05-13 DIAGNOSIS — M47817 Spondylosis without myelopathy or radiculopathy, lumbosacral region: Secondary | ICD-10-CM | POA: Diagnosis not present

## 2022-05-14 DIAGNOSIS — J069 Acute upper respiratory infection, unspecified: Secondary | ICD-10-CM | POA: Diagnosis not present

## 2022-05-14 DIAGNOSIS — L089 Local infection of the skin and subcutaneous tissue, unspecified: Secondary | ICD-10-CM | POA: Diagnosis not present

## 2022-05-25 DIAGNOSIS — F1120 Opioid dependence, uncomplicated: Secondary | ICD-10-CM | POA: Diagnosis not present

## 2022-05-26 DIAGNOSIS — G5622 Lesion of ulnar nerve, left upper limb: Secondary | ICD-10-CM | POA: Diagnosis not present

## 2022-05-26 DIAGNOSIS — J019 Acute sinusitis, unspecified: Secondary | ICD-10-CM | POA: Diagnosis not present

## 2022-05-26 DIAGNOSIS — I10 Essential (primary) hypertension: Secondary | ICD-10-CM | POA: Diagnosis not present

## 2022-05-26 DIAGNOSIS — Z1330 Encounter for screening examination for mental health and behavioral disorders, unspecified: Secondary | ICD-10-CM | POA: Diagnosis not present

## 2022-05-26 DIAGNOSIS — Z79899 Other long term (current) drug therapy: Secondary | ICD-10-CM | POA: Diagnosis not present

## 2022-05-26 DIAGNOSIS — R200 Anesthesia of skin: Secondary | ICD-10-CM | POA: Diagnosis not present

## 2022-05-26 DIAGNOSIS — M48062 Spinal stenosis, lumbar region with neurogenic claudication: Secondary | ICD-10-CM | POA: Diagnosis not present

## 2022-05-26 DIAGNOSIS — R29898 Other symptoms and signs involving the musculoskeletal system: Secondary | ICD-10-CM | POA: Diagnosis not present

## 2022-06-04 DIAGNOSIS — M5136 Other intervertebral disc degeneration, lumbar region: Secondary | ICD-10-CM | POA: Diagnosis not present

## 2022-06-04 DIAGNOSIS — I10 Essential (primary) hypertension: Secondary | ICD-10-CM | POA: Diagnosis not present

## 2022-06-04 DIAGNOSIS — Z809 Family history of malignant neoplasm, unspecified: Secondary | ICD-10-CM | POA: Diagnosis not present

## 2022-06-04 DIAGNOSIS — M48061 Spinal stenosis, lumbar region without neurogenic claudication: Secondary | ICD-10-CM | POA: Diagnosis not present

## 2022-06-04 DIAGNOSIS — R200 Anesthesia of skin: Secondary | ICD-10-CM | POA: Diagnosis not present

## 2022-06-04 DIAGNOSIS — Z6835 Body mass index (BMI) 35.0-35.9, adult: Secondary | ICD-10-CM | POA: Diagnosis not present

## 2022-06-04 DIAGNOSIS — M48062 Spinal stenosis, lumbar region with neurogenic claudication: Secondary | ICD-10-CM | POA: Diagnosis not present

## 2022-06-04 DIAGNOSIS — Z79899 Other long term (current) drug therapy: Secondary | ICD-10-CM | POA: Diagnosis not present

## 2022-06-04 DIAGNOSIS — Z87891 Personal history of nicotine dependence: Secondary | ICD-10-CM | POA: Diagnosis not present

## 2022-06-04 DIAGNOSIS — G8929 Other chronic pain: Secondary | ICD-10-CM | POA: Diagnosis not present

## 2022-06-04 DIAGNOSIS — F909 Attention-deficit hyperactivity disorder, unspecified type: Secondary | ICD-10-CM | POA: Diagnosis not present

## 2022-06-04 DIAGNOSIS — R29898 Other symptoms and signs involving the musculoskeletal system: Secondary | ICD-10-CM | POA: Diagnosis not present

## 2022-06-04 DIAGNOSIS — E669 Obesity, unspecified: Secondary | ICD-10-CM | POA: Diagnosis not present

## 2022-06-04 DIAGNOSIS — G5622 Lesion of ulnar nerve, left upper limb: Secondary | ICD-10-CM | POA: Diagnosis not present

## 2022-06-04 DIAGNOSIS — M47816 Spondylosis without myelopathy or radiculopathy, lumbar region: Secondary | ICD-10-CM | POA: Diagnosis not present

## 2022-06-06 DIAGNOSIS — I10 Essential (primary) hypertension: Secondary | ICD-10-CM | POA: Diagnosis not present

## 2022-06-06 DIAGNOSIS — M5410 Radiculopathy, site unspecified: Secondary | ICD-10-CM | POA: Diagnosis not present

## 2022-07-19 DIAGNOSIS — Z79899 Other long term (current) drug therapy: Secondary | ICD-10-CM | POA: Diagnosis not present

## 2022-07-20 DIAGNOSIS — F1120 Opioid dependence, uncomplicated: Secondary | ICD-10-CM | POA: Diagnosis not present

## 2022-07-22 DIAGNOSIS — I10 Essential (primary) hypertension: Secondary | ICD-10-CM | POA: Diagnosis not present

## 2022-08-19 DIAGNOSIS — I10 Essential (primary) hypertension: Secondary | ICD-10-CM | POA: Diagnosis not present

## 2022-08-19 DIAGNOSIS — M5410 Radiculopathy, site unspecified: Secondary | ICD-10-CM | POA: Diagnosis not present

## 2022-08-19 DIAGNOSIS — R200 Anesthesia of skin: Secondary | ICD-10-CM | POA: Diagnosis not present

## 2022-08-19 DIAGNOSIS — N529 Male erectile dysfunction, unspecified: Secondary | ICD-10-CM | POA: Diagnosis not present

## 2022-09-09 DIAGNOSIS — R200 Anesthesia of skin: Secondary | ICD-10-CM | POA: Diagnosis not present

## 2022-09-19 DIAGNOSIS — F1120 Opioid dependence, uncomplicated: Secondary | ICD-10-CM | POA: Diagnosis not present

## 2022-09-23 DIAGNOSIS — R200 Anesthesia of skin: Secondary | ICD-10-CM | POA: Diagnosis not present

## 2022-09-23 DIAGNOSIS — J019 Acute sinusitis, unspecified: Secondary | ICD-10-CM | POA: Diagnosis not present

## 2022-10-14 DIAGNOSIS — G5603 Carpal tunnel syndrome, bilateral upper limbs: Secondary | ICD-10-CM | POA: Diagnosis not present

## 2022-10-14 DIAGNOSIS — E669 Obesity, unspecified: Secondary | ICD-10-CM | POA: Diagnosis not present

## 2022-10-14 DIAGNOSIS — M2550 Pain in unspecified joint: Secondary | ICD-10-CM | POA: Diagnosis not present

## 2022-10-14 DIAGNOSIS — E291 Testicular hypofunction: Secondary | ICD-10-CM | POA: Diagnosis not present

## 2022-10-14 DIAGNOSIS — M6281 Muscle weakness (generalized): Secondary | ICD-10-CM | POA: Diagnosis not present

## 2022-10-14 DIAGNOSIS — Z79890 Hormone replacement therapy (postmenopausal): Secondary | ICD-10-CM | POA: Diagnosis not present

## 2022-10-14 DIAGNOSIS — R5383 Other fatigue: Secondary | ICD-10-CM | POA: Diagnosis not present

## 2022-11-01 DIAGNOSIS — Z79899 Other long term (current) drug therapy: Secondary | ICD-10-CM | POA: Diagnosis not present

## 2022-11-09 DIAGNOSIS — F1120 Opioid dependence, uncomplicated: Secondary | ICD-10-CM | POA: Diagnosis not present

## 2022-11-25 DIAGNOSIS — J019 Acute sinusitis, unspecified: Secondary | ICD-10-CM | POA: Diagnosis not present

## 2022-11-25 DIAGNOSIS — J3489 Other specified disorders of nose and nasal sinuses: Secondary | ICD-10-CM | POA: Diagnosis not present

## 2022-11-25 DIAGNOSIS — K5900 Constipation, unspecified: Secondary | ICD-10-CM | POA: Diagnosis not present

## 2022-11-25 DIAGNOSIS — F1190 Opioid use, unspecified, uncomplicated: Secondary | ICD-10-CM | POA: Diagnosis not present

## 2022-12-09 DIAGNOSIS — I10 Essential (primary) hypertension: Secondary | ICD-10-CM | POA: Diagnosis not present

## 2022-12-09 DIAGNOSIS — E669 Obesity, unspecified: Secondary | ICD-10-CM | POA: Diagnosis not present

## 2022-12-09 DIAGNOSIS — Z79899 Other long term (current) drug therapy: Secondary | ICD-10-CM | POA: Diagnosis not present

## 2022-12-09 DIAGNOSIS — G5603 Carpal tunnel syndrome, bilateral upper limbs: Secondary | ICD-10-CM | POA: Diagnosis not present

## 2022-12-09 DIAGNOSIS — Z6839 Body mass index (BMI) 39.0-39.9, adult: Secondary | ICD-10-CM | POA: Diagnosis not present

## 2022-12-09 DIAGNOSIS — G5601 Carpal tunnel syndrome, right upper limb: Secondary | ICD-10-CM | POA: Diagnosis not present

## 2022-12-12 DIAGNOSIS — Z886 Allergy status to analgesic agent status: Secondary | ICD-10-CM | POA: Diagnosis not present

## 2022-12-12 DIAGNOSIS — Z79891 Long term (current) use of opiate analgesic: Secondary | ICD-10-CM | POA: Diagnosis not present

## 2022-12-12 DIAGNOSIS — M25531 Pain in right wrist: Secondary | ICD-10-CM | POA: Diagnosis not present

## 2022-12-15 DIAGNOSIS — F1120 Opioid dependence, uncomplicated: Secondary | ICD-10-CM | POA: Diagnosis not present

## 2022-12-16 DIAGNOSIS — I10 Essential (primary) hypertension: Secondary | ICD-10-CM | POA: Diagnosis not present

## 2023-01-18 DIAGNOSIS — Z79899 Other long term (current) drug therapy: Secondary | ICD-10-CM | POA: Diagnosis not present

## 2023-02-21 DIAGNOSIS — F1120 Opioid dependence, uncomplicated: Secondary | ICD-10-CM | POA: Diagnosis not present

## 2023-04-19 DIAGNOSIS — F1120 Opioid dependence, uncomplicated: Secondary | ICD-10-CM | POA: Diagnosis not present

## 2023-05-05 DIAGNOSIS — E291 Testicular hypofunction: Secondary | ICD-10-CM | POA: Diagnosis not present

## 2023-05-05 DIAGNOSIS — Z79890 Hormone replacement therapy (postmenopausal): Secondary | ICD-10-CM | POA: Diagnosis not present

## 2023-05-05 DIAGNOSIS — E063 Autoimmune thyroiditis: Secondary | ICD-10-CM | POA: Diagnosis not present

## 2023-05-09 DIAGNOSIS — Z79899 Other long term (current) drug therapy: Secondary | ICD-10-CM | POA: Diagnosis not present

## 2023-05-12 DIAGNOSIS — E291 Testicular hypofunction: Secondary | ICD-10-CM | POA: Diagnosis not present

## 2023-05-12 DIAGNOSIS — R454 Irritability and anger: Secondary | ICD-10-CM | POA: Diagnosis not present

## 2023-05-12 DIAGNOSIS — Z6839 Body mass index (BMI) 39.0-39.9, adult: Secondary | ICD-10-CM | POA: Diagnosis not present

## 2023-06-15 DIAGNOSIS — Z79899 Other long term (current) drug therapy: Secondary | ICD-10-CM | POA: Diagnosis not present

## 2023-06-21 DIAGNOSIS — F1120 Opioid dependence, uncomplicated: Secondary | ICD-10-CM | POA: Diagnosis not present

## 2023-06-28 ENCOUNTER — Other Ambulatory Visit: Payer: Self-pay

## 2023-06-28 ENCOUNTER — Emergency Department (HOSPITAL_BASED_OUTPATIENT_CLINIC_OR_DEPARTMENT_OTHER)

## 2023-06-28 ENCOUNTER — Emergency Department (HOSPITAL_BASED_OUTPATIENT_CLINIC_OR_DEPARTMENT_OTHER)
Admission: EM | Admit: 2023-06-28 | Discharge: 2023-06-29 | Discharge status: Against Medical Advice | Source: Home / Self Care | Attending: Emergency Medicine | Admitting: Emergency Medicine

## 2023-06-28 ENCOUNTER — Encounter (HOSPITAL_BASED_OUTPATIENT_CLINIC_OR_DEPARTMENT_OTHER): Payer: Self-pay

## 2023-06-28 ENCOUNTER — Emergency Department (HOSPITAL_BASED_OUTPATIENT_CLINIC_OR_DEPARTMENT_OTHER): Admitting: Radiology

## 2023-06-28 DIAGNOSIS — R7989 Other specified abnormal findings of blood chemistry: Secondary | ICD-10-CM | POA: Diagnosis not present

## 2023-06-28 DIAGNOSIS — I10 Essential (primary) hypertension: Secondary | ICD-10-CM | POA: Diagnosis not present

## 2023-06-28 DIAGNOSIS — F1020 Alcohol dependence, uncomplicated: Secondary | ICD-10-CM | POA: Diagnosis not present

## 2023-06-28 DIAGNOSIS — F909 Attention-deficit hyperactivity disorder, unspecified type: Secondary | ICD-10-CM | POA: Diagnosis not present

## 2023-06-28 DIAGNOSIS — Z79899 Other long term (current) drug therapy: Secondary | ICD-10-CM | POA: Diagnosis not present

## 2023-06-28 DIAGNOSIS — K219 Gastro-esophageal reflux disease without esophagitis: Secondary | ICD-10-CM | POA: Diagnosis not present

## 2023-06-28 DIAGNOSIS — R109 Unspecified abdominal pain: Secondary | ICD-10-CM | POA: Diagnosis not present

## 2023-06-28 DIAGNOSIS — N289 Disorder of kidney and ureter, unspecified: Secondary | ICD-10-CM | POA: Diagnosis not present

## 2023-06-28 DIAGNOSIS — N179 Acute kidney failure, unspecified: Secondary | ICD-10-CM | POA: Insufficient documentation

## 2023-06-28 DIAGNOSIS — R443 Hallucinations, unspecified: Secondary | ICD-10-CM | POA: Diagnosis not present

## 2023-06-28 DIAGNOSIS — Z5329 Procedure and treatment not carried out because of patient's decision for other reasons: Secondary | ICD-10-CM | POA: Insufficient documentation

## 2023-06-28 DIAGNOSIS — R0789 Other chest pain: Secondary | ICD-10-CM | POA: Insufficient documentation

## 2023-06-28 DIAGNOSIS — F1120 Opioid dependence, uncomplicated: Secondary | ICD-10-CM | POA: Diagnosis not present

## 2023-06-28 DIAGNOSIS — Z0189 Encounter for other specified special examinations: Secondary | ICD-10-CM | POA: Diagnosis not present

## 2023-06-28 DIAGNOSIS — R079 Chest pain, unspecified: Secondary | ICD-10-CM | POA: Diagnosis not present

## 2023-06-28 DIAGNOSIS — M25512 Pain in left shoulder: Secondary | ICD-10-CM | POA: Diagnosis not present

## 2023-06-28 HISTORY — DX: Essential (primary) hypertension: I10

## 2023-06-28 LAB — HEPATIC FUNCTION PANEL
ALT: 24 U/L (ref 0–44)
AST: 29 U/L (ref 15–41)
Albumin: 4.5 g/dL (ref 3.5–5.0)
Alkaline Phosphatase: 75 U/L (ref 38–126)
Bilirubin, Direct: 0.2 mg/dL (ref 0.0–0.2)
Indirect Bilirubin: 0.3 mg/dL (ref 0.3–0.9)
Total Bilirubin: 0.5 mg/dL (ref 0.0–1.2)
Total Protein: 8.3 g/dL — ABNORMAL HIGH (ref 6.5–8.1)

## 2023-06-28 LAB — BASIC METABOLIC PANEL WITH GFR
Anion gap: 17 — ABNORMAL HIGH (ref 5–15)
BUN: 68 mg/dL — ABNORMAL HIGH (ref 6–20)
CO2: 22 mmol/L (ref 22–32)
Calcium: 10 mg/dL (ref 8.9–10.3)
Chloride: 94 mmol/L — ABNORMAL LOW (ref 98–111)
Creatinine, Ser: 6.17 mg/dL — ABNORMAL HIGH (ref 0.61–1.24)
GFR, Estimated: 11 mL/min — ABNORMAL LOW (ref >60)
Glucose, Bld: 110 mg/dL — ABNORMAL HIGH (ref 70–99)
Potassium: 5 mmol/L (ref 3.5–5.1)
Sodium: 132 mmol/L — ABNORMAL LOW (ref 135–145)

## 2023-06-28 LAB — CBC WITH DIFFERENTIAL/PLATELET
Abs Immature Granulocytes: 0.02 10*3/uL (ref 0.00–0.07)
Basophils Absolute: 0 10*3/uL (ref 0.0–0.1)
Basophils Relative: 1 %
Eosinophils Absolute: 0.2 10*3/uL (ref 0.0–0.5)
Eosinophils Relative: 2 %
HCT: 40.9 % (ref 39.0–52.0)
Hemoglobin: 14 g/dL (ref 13.0–17.0)
Immature Granulocytes: 0 %
Lymphocytes Relative: 24 %
Lymphs Abs: 1.9 10*3/uL (ref 0.7–4.0)
MCH: 30.2 pg (ref 26.0–34.0)
MCHC: 34.2 g/dL (ref 30.0–36.0)
MCV: 88.1 fL (ref 80.0–100.0)
Monocytes Absolute: 0.6 10*3/uL (ref 0.1–1.0)
Monocytes Relative: 7 %
Neutro Abs: 5.5 10*3/uL (ref 1.7–7.7)
Neutrophils Relative %: 66 %
Platelets: 353 10*3/uL (ref 150–400)
RBC: 4.64 MIL/uL (ref 4.22–5.81)
RDW: 13.3 % (ref 11.5–15.5)
WBC: 8.2 10*3/uL (ref 4.0–10.5)
nRBC: 0 % (ref 0.0–0.2)

## 2023-06-28 LAB — URINALYSIS, ROUTINE W REFLEX MICROSCOPIC
Bacteria, UA: NONE SEEN
Glucose, UA: NEGATIVE mg/dL
Hgb urine dipstick: NEGATIVE
Ketones, ur: 15 mg/dL — AB
Nitrite: NEGATIVE
Protein, ur: 30 mg/dL — AB
Specific Gravity, Urine: 1.027 (ref 1.005–1.030)
pH: 5 (ref 5.0–8.0)

## 2023-06-28 LAB — TROPONIN T, HIGH SENSITIVITY
Troponin T High Sensitivity: 73 ng/L — ABNORMAL HIGH (ref <19)
Troponin T High Sensitivity: 62 ng/L — ABNORMAL HIGH (ref <19)

## 2023-06-28 LAB — CK: Total CK: 390 U/L (ref 49–397)

## 2023-06-28 LAB — MAGNESIUM: Magnesium: 1.9 mg/dL (ref 1.7–2.4)

## 2023-06-28 MED ORDER — LACTATED RINGERS IV SOLN: INTRAVENOUS | Status: AC

## 2023-06-28 MED ORDER — LACTATED RINGERS IV BOLUS
1000.0000 mL | Freq: Once | INTRAVENOUS | Status: AC
  Administered 2023-06-28: 1000 mL via INTRAVENOUS

## 2023-06-28 MED ORDER — ASPIRIN 81 MG PO CHEW
324.0000 mg | CHEWABLE_TABLET | Freq: Once | ORAL | Status: AC
  Administered 2023-06-28: 324 mg via ORAL
  Filled 2023-06-28: qty 4

## 2023-06-28 NOTE — ED Notes (Addendum)
 Multiple snacks/drinks offered to pt; pt declined and states he would 'like the [employer] in his car'. Pt states he 'wants to go to his car for his [employer] or he will leave to get it'. Pt advised against this. EDP notified. With expressed verbal permission from pt, this RN and Jaylen from [employer] went to patient's car to retrieve pt's [employer]. Pt's [employer] retrieved, pt's silver car secured and locked back. Security team member Jaylen witnessed retrieval and verified that car was locked/secured. Patient's car keys returned back to patient with the [employer].

## 2023-06-28 NOTE — ED Notes (Signed)
 Pt SBP 70's. Repositioned blood pressure cuff and retook in other arm. PA notified and orders received. VSS at this time.

## 2023-06-28 NOTE — ED Provider Notes (Signed)
 [place] EMERGENCY DEPARTMENT AT [facility] Provider Note   CSN: [id number] Arrival date & time: 06/28/23  1302     Patient presents with: Chest Pain   Brian Hobbs is a 43 y.o. male.   43 year old male presents today for concern of chest pain that radiates into his left arm.  This has been ongoing for the past 3 days.  Denies previous history of CAD.  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  He states he drinks about 10 bottles of water  a day.  He does have decrease in his urine output over the past few days but he states this is typical for him in the heat.  Denies any shortness of breath.  No abdominal pain, nausea, vomiting.  Denies any significant NSAID use.  His chest pain is positional and worse when he pushes on the left side of his chest.  The history is provided by the patient. No language interpreter was used.       Prior to Admission medications   Medication Sig Start Date End Date Taking? Authorizing Provider  hydrochlorothiazide (HYDRODIURIL) 25 MG tablet Take 25 mg by mouth daily. 06/09/23  Yes [provider]  valsartan (DIOVAN) 80 MG tablet Take by mouth. 04/13/22  Yes [provider]  amphetamine -dextroamphetamine (ADDERALL XR) 20 MG 24 hr capsule Take 20 mg by mouth 3 (three) times daily. 03/03/21   [provider]  amphetamine -dextroamphetamine (ADDERALL) 20 MG tablet Take 20 mg by mouth 4 (four) times daily.    [provider]  buprenorphine  (SUBUTEX ) 8 MG SUBL SL tablet Place 8 mg under the tongue 3 (three) times daily.    [provider]  Buprenorphine  HCl-Naloxone  HCl 8-2 MG FILM Place under the tongue 3 (three) times daily. 03/03/21   [provider]  diphenhydrAMINE  (BENADRYL ) 25 mg capsule Take 1 capsule (25 mg total) by mouth every 6 (six) hours as needed for itching. Patient not taking: Reported on 03/05/2021 07/13/11 07/23/11  Kirichenko,  Tatyana, PA-C  famotidine  (PEPCID ) 20 MG tablet Take 1 tablet (20 mg total) by mouth 2 (two) times daily. 07/13/11 07/12/12  Kirichenko, Tatyana, PA-C  gabapentin  (NEURONTIN ) 300 MG capsule Take 300 mg by mouth 3 (three) times daily.    [provider]  Iodine, Kelp, 0.15 MG TABS Take 1 tablet by mouth daily.    [provider]  predniSONE  (DELTASONE ) 10 MG tablet Take 6 tab day 1, 5 tab day 2, 4 tab day 3, 3 tab day 4, 2 tab day 5, 1 tab day 6. Take PO Patient not taking: Reported on 03/05/2021 07/13/11   Kirichenko, Tatyana, PA-C  risperiDONE  (RISPERDAL ) 1 MG tablet Take 1 mg by mouth 2 (two) times daily.    [provider]  sildenafil (VIAGRA) 50 MG tablet Take 50 mg by mouth daily as needed.    [provider]  testosterone cypionate (DEPOTESTOSTERONE CYPIONATE) 200 MG/ML injection Inject 200 mg into the muscle every 7 (seven) days. 02/23/21   [provider]  zinc gluconate 50 MG tablet Take 50 mg by mouth daily.    [provider]    Allergies: Nsaids    Review of Systems  Constitutional:  Negative for chills and fever.  Respiratory:  Negative for shortness of breath.   Cardiovascular:  Positive for chest pain.  All other systems reviewed and are negative.   Updated Vital Signs BP 103/64 (BP Location: Right  Arm)   Pulse 96   Temp 98.1 F (36.7 C) (Oral)   Resp 16   SpO2 97%   Physical Exam Vitals and nursing note reviewed.  Constitutional:      General: He is not in acute distress.    Appearance: Normal appearance. He is not ill-appearing.  HENT:     Head: Normocephalic and atraumatic.     Nose: Nose normal.   Eyes:     Conjunctiva/sclera: Conjunctivae normal.    Cardiovascular:     Rate and Rhythm: Normal rate and regular rhythm.  Pulmonary:     Effort: Pulmonary effort is normal. No respiratory distress.     Breath sounds: Normal breath sounds. No wheezing.  Abdominal:     Palpations: Abdomen is soft.    Musculoskeletal:        General: No deformity. Normal range of motion.     Cervical back: Normal range of motion.     Right lower leg: No edema.     Left lower leg: No edema.   Skin:    Findings: No rash.   Neurological:     Mental Status: He is alert.     (all labs ordered are listed, but only abnormal results are displayed) Labs Reviewed  BASIC METABOLIC PANEL WITH GFR - Abnormal; Notable for the following components:      Result Value   Sodium 132 (*)    Chloride 94 (*)    Glucose, Bld 110 (*)    BUN 68 (*)    Creatinine, Ser 6.17 (*)    GFR, Estimated 11 (*)    Anion gap 17 (*)    All other components within normal limits  URINALYSIS, ROUTINE W REFLEX MICROSCOPIC - Abnormal; Notable for the following components:   Bilirubin Urine SMALL (*)    Ketones, ur 15 (*)    Protein, ur 30 (*)    Leukocytes,Ua SMALL (*)    All other components within normal limits  HEPATIC FUNCTION PANEL - Abnormal; Notable for the following components:   Total Protein 8.3 (*)    All other components within normal limits  TROPONIN T, HIGH SENSITIVITY - Abnormal; Notable for the following components:   Troponin T High Sensitivity 73 (*)    All other components within normal limits  TROPONIN T, HIGH SENSITIVITY - Abnormal; Notable for the following components:   Troponin T High Sensitivity 62 (*)    All other components within normal limits  CBC WITH DIFFERENTIAL/PLATELET  MAGNESIUM  CK    EKG: EKG Interpretation Date/Time:  Wednesday June 28 2023 13:11:58 EDT Ventricular Rate:  112 PR Interval:  137 QRS Duration:  96 QT Interval:  300 QTC Calculation: 410 R Axis:   64  Text Interpretation: Sinus tachycardia Confirmed by Ruthe Cornet [phone]) on 06/28/2023 4:57:56 PM  Radiology: CT ABDOMEN PELVIS WO CONTRAST Result Date: 06/28/2023 CLINICAL DATA:  Abdominal pain. EXAM: CT ABDOMEN AND PELVIS WITHOUT CONTRAST TECHNIQUE: Multidetector CT imaging of the abdomen and pelvis was  performed following the standard protocol without IV contrast. RADIATION DOSE REDUCTION: This exam was performed according to the departmental dose-optimization program which includes automated exposure control, adjustment of the mA and/or kV according to patient size and/or use of iterative reconstruction technique. COMPARISON:  None Available. FINDINGS: Evaluation of this exam is limited in the absence of intravenous contrast. Lower chest: The visualized lung bases are clear. No intra-abdominal free air or free fluid. Hepatobiliary: No focal liver abnormality is seen. No gallstones, gallbladder wall  thickening, or biliary dilatation. Pancreas: Unremarkable. No pancreatic ductal dilatation or surrounding inflammatory changes. Spleen: Normal in size without focal abnormality. Adrenals/Urinary Tract: The adrenal glands unremarkable there is no hydronephrosis or nephrolithiasis on either side. The visualized ureters and urinary bladder appear unremarkable Stomach/Bowel: There is no bowel obstruction or active inflammation. The appendix is normal. Vascular/Lymphatic: The abdominal aorta and IVC unremarkable. No free gas. There is no adenopathy. Reproductive: The prostate and seminal vesicles are grossly unremarkable. No pelvic mass. Other: None Musculoskeletal: Degenerative changes of the spine. No acute osseous pathology. IMPRESSION: 1. No acute intra-abdominal or pelvic pathology. No hydronephrosis or nephrolithiasis. 2. No bowel obstruction. Normal appendix. Electronically Signed   By: Vanetta Chou M.D.   On: 06/28/2023 15:52   DG Chest 2 View Result Date: 06/28/2023 CLINICAL DATA:  Chest pain for 3 days. EXAM: CHEST - 2 VIEW COMPARISON:  None available currently. FINDINGS: The heart size and mediastinal contours are within normal limits. Both lungs are clear. The visualized skeletal structures are unremarkable. IMPRESSION: No active cardiopulmonary disease. Electronically Signed   By: Lynwood Landy Raddle M.D.    On: 06/28/2023 14:13     .Critical Care  Performed by: Hildegard Loge, PA-C Authorized by: Hildegard Loge, PA-C   Critical care provider statement:    Critical care time (minutes):  30   Critical care was necessary to treat or prevent imminent or life-threatening deterioration of the following conditions: Hypotension, AKI.   Critical care was time spent personally by me on the following activities:  Development of treatment plan with patient or surrogate, discussions with consultants, evaluation of patient's response to treatment, examination of patient, ordering and review of laboratory studies, ordering and review of radiographic studies, ordering and performing treatments and interventions, pulse oximetry, re-evaluation of patient's condition and review of old charts   Care discussed with: admitting provider      Medications Ordered in the ED  lactated ringers  infusion (has no administration in time range)  lactated ringers  bolus 1,000 mL (0 mLs Intravenous Stopped 06/28/23 1702)  lactated ringers  bolus 1,000 mL (0 mLs Intravenous Stopped 06/28/23 1702)  aspirin  chewable tablet 324 mg (324 mg Oral Given 06/28/23 1516)  lactated ringers  bolus 1,000 mL (1,000 mLs Intravenous New Bag/Given 06/28/23 1657)                                    Medical Decision Making Amount and/or Complexity of Data Reviewed Labs: ordered. Radiology: ordered.  Risk OTC drugs. Prescription drug management.   Medical Decision Making / ED Course   This patient presents to the ED for concern of chest pain, this involves an extensive number of treatment options, and is a complaint that carries with it a high risk of complications and morbidity.  The differential diagnosis includes ACS, PE, pneumonia, MSK etiology, GERD, rhabdo  MDM: 43 year old male presents today for above-mentioned complaints. Overall well-appearing. CBC unremarkable, BMP shows creatinine 6.17 with a baseline of 0.7, BUN elevated at 68, anion  gap of 17 otherwise without acute concern.  Magnesium within normal, CK within normal.  Troponin initially 78, repeat 62 not consistent with ACS.  EKG without acute ischemic change.  UA shows small leukocytes, ketones present.  Hepatic function panel normal. Soft blood pressures.  Responds well to fluids. Discussed with nephrology.  They agree with management and will follow. Will discuss with hospitalist.  Discussed with hospitalist.  They will evaluate  patient for admission.    Lab Tests: -I ordered, reviewed, and interpreted labs.   The pertinent results include:   Labs Reviewed  BASIC METABOLIC PANEL WITH GFR - Abnormal; Notable for the following components:      Result Value   Sodium 132 (*)    Chloride 94 (*)    Glucose, Bld 110 (*)    BUN 68 (*)    Creatinine, Ser 6.17 (*)    GFR, Estimated 11 (*)    Anion gap 17 (*)    All other components within normal limits  URINALYSIS, ROUTINE W REFLEX MICROSCOPIC - Abnormal; Notable for the following components:   Bilirubin Urine SMALL (*)    Ketones, ur 15 (*)    Protein, ur 30 (*)    Leukocytes,Ua SMALL (*)    All other components within normal limits  HEPATIC FUNCTION PANEL - Abnormal; Notable for the following components:   Total Protein 8.3 (*)    All other components within normal limits  TROPONIN T, HIGH SENSITIVITY - Abnormal; Notable for the following components:   Troponin T High Sensitivity 73 (*)    All other components within normal limits  TROPONIN T, HIGH SENSITIVITY - Abnormal; Notable for the following components:   Troponin T High Sensitivity 62 (*)    All other components within normal limits  CBC WITH DIFFERENTIAL/PLATELET  MAGNESIUM  CK      EKG  EKG Interpretation Date/Time:  Wednesday June 28 2023 13:11:58 EDT Ventricular Rate:  112 PR Interval:  137 QRS Duration:  96 QT Interval:  300 QTC Calculation: 410 R Axis:   64  Text Interpretation: Sinus tachycardia Confirmed by Ruthe Cornet [phone])  on 06/28/2023 4:57:56 PM         Imaging Studies ordered: I ordered imaging studies including chest x-ray I independently visualized and interpreted imaging. I agree with the radiologist interpretation   Medicines ordered and prescription drug management: Meds ordered this encounter  Medications   lactated ringers  bolus 1,000 mL   lactated ringers  bolus 1,000 mL   aspirin  chewable tablet 324 mg   lactated ringers  bolus 1,000 mL   lactated ringers  infusion    -I have reviewed the patients home medicines and have made adjustments as needed  Critical interventions Hypotension, volume resuscitation, nephrology consult  Consultations Obtained: I requested consultation with the urology,  and discussed lab and imaging findings as well as pertinent plan - they recommend: As above    Reevaluation: After the interventions noted above, I reevaluated the patient and found that they have :improved  Co morbidities that complicate the patient evaluation  Past Medical History:  Diagnosis Date   Hypertension       Dispostion: Discussed with hospitalist will evaluate patient for admission    Final diagnoses:  AKI (acute kidney injury) [facility])    ED Discharge Orders     None          Hildegard Loge, PA-C 06/29/23 2311    Ruthe Cornet, DO 07/03/23 1146

## 2023-06-28 NOTE — ED Triage Notes (Signed)
 Patient reports chest pain for past 3 days that is running down his left arm.

## 2023-06-28 NOTE — ED Notes (Signed)
 Spoke with lab about hepatic function panel and CK add-on.

## 2023-06-28 NOTE — ED Notes (Signed)
 Pt approved to eat per EDP approval. Pt given crackers and water .

## 2023-06-29 ENCOUNTER — Other Ambulatory Visit: Payer: Self-pay

## 2023-06-29 ENCOUNTER — Encounter (HOSPITAL_COMMUNITY): Payer: Self-pay

## 2023-06-29 ENCOUNTER — Emergency Department (HOSPITAL_COMMUNITY)

## 2023-06-29 ENCOUNTER — Observation Stay (HOSPITAL_COMMUNITY)
Admission: EM | Admit: 2023-06-29 | Discharge: 2023-06-30 | Disposition: A | Discharge status: Home / Self Care | Attending: Infectious Diseases | Admitting: Infectious Diseases

## 2023-06-29 DIAGNOSIS — R7989 Other specified abnormal findings of blood chemistry: Secondary | ICD-10-CM | POA: Insufficient documentation

## 2023-06-29 DIAGNOSIS — N179 Acute kidney failure, unspecified: Secondary | ICD-10-CM | POA: Diagnosis not present

## 2023-06-29 DIAGNOSIS — F909 Attention-deficit hyperactivity disorder, unspecified type: Secondary | ICD-10-CM | POA: Insufficient documentation

## 2023-06-29 DIAGNOSIS — Z79899 Other long term (current) drug therapy: Secondary | ICD-10-CM | POA: Insufficient documentation

## 2023-06-29 DIAGNOSIS — R443 Hallucinations, unspecified: Secondary | ICD-10-CM | POA: Insufficient documentation

## 2023-06-29 DIAGNOSIS — Z5329 Procedure and treatment not carried out because of patient's decision for other reasons: Secondary | ICD-10-CM | POA: Insufficient documentation

## 2023-06-29 DIAGNOSIS — K219 Gastro-esophageal reflux disease without esophagitis: Secondary | ICD-10-CM | POA: Insufficient documentation

## 2023-06-29 DIAGNOSIS — R0789 Other chest pain: Secondary | ICD-10-CM | POA: Insufficient documentation

## 2023-06-29 DIAGNOSIS — F1020 Alcohol dependence, uncomplicated: Secondary | ICD-10-CM | POA: Insufficient documentation

## 2023-06-29 DIAGNOSIS — F1120 Opioid dependence, uncomplicated: Secondary | ICD-10-CM | POA: Insufficient documentation

## 2023-06-29 DIAGNOSIS — Z0189 Encounter for other specified special examinations: Secondary | ICD-10-CM | POA: Diagnosis not present

## 2023-06-29 DIAGNOSIS — M25512 Pain in left shoulder: Secondary | ICD-10-CM | POA: Insufficient documentation

## 2023-06-29 DIAGNOSIS — I10 Essential (primary) hypertension: Secondary | ICD-10-CM | POA: Insufficient documentation

## 2023-06-29 LAB — URINALYSIS, ROUTINE W REFLEX MICROSCOPIC
Bilirubin Urine: NEGATIVE
Glucose, UA: 50 mg/dL — AB
Ketones, ur: NEGATIVE mg/dL
Leukocytes,Ua: NEGATIVE
Nitrite: NEGATIVE
Protein, ur: NEGATIVE mg/dL
Specific Gravity, Urine: 1.011 (ref 1.005–1.030)
pH: 5 (ref 5.0–8.0)

## 2023-06-29 LAB — CBC WITH DIFFERENTIAL/PLATELET
Abs Immature Granulocytes: 0.01 10*3/uL (ref 0.00–0.07)
Basophils Absolute: 0 10*3/uL (ref 0.0–0.1)
Basophils Relative: 1 %
Eosinophils Absolute: 0.1 10*3/uL (ref 0.0–0.5)
Eosinophils Relative: 2 %
HCT: 41.1 % (ref 39.0–52.0)
Hemoglobin: 14 g/dL (ref 13.0–17.0)
Immature Granulocytes: 0 %
Lymphocytes Relative: 22 %
Lymphs Abs: 1.3 10*3/uL (ref 0.7–4.0)
MCH: 30.6 pg (ref 26.0–34.0)
MCHC: 34.1 g/dL (ref 30.0–36.0)
MCV: 89.9 fL (ref 80.0–100.0)
Monocytes Absolute: 0.4 10*3/uL (ref 0.1–1.0)
Monocytes Relative: 7 %
Neutro Abs: 4 10*3/uL (ref 1.7–7.7)
Neutrophils Relative %: 68 %
Platelets: 312 10*3/uL (ref 150–400)
RBC: 4.57 MIL/uL (ref 4.22–5.81)
RDW: 13.3 % (ref 11.5–15.5)
WBC: 5.8 10*3/uL (ref 4.0–10.5)
nRBC: 0 % (ref 0.0–0.2)

## 2023-06-29 LAB — HEPATITIS PANEL, ACUTE
HCV Ab: NONREACTIVE
Hep A IgM: NONREACTIVE
Hep B C IgM: NONREACTIVE
Hepatitis B Surface Ag: NONREACTIVE

## 2023-06-29 LAB — SODIUM, URINE, RANDOM: Sodium, Ur: 96 mmol/L

## 2023-06-29 LAB — COMPREHENSIVE METABOLIC PANEL WITH GFR
ALT: 23 U/L (ref 0–44)
AST: 24 U/L (ref 15–41)
Albumin: 3.7 g/dL (ref 3.5–5.0)
Alkaline Phosphatase: 54 U/L (ref 38–126)
Anion gap: 9 (ref 5–15)
BUN: 47 mg/dL — ABNORMAL HIGH (ref 6–20)
CO2: 23 mmol/L (ref 22–32)
Calcium: 9.3 mg/dL (ref 8.9–10.3)
Chloride: 103 mmol/L (ref 98–111)
Creatinine, Ser: 2.47 mg/dL — ABNORMAL HIGH (ref 0.61–1.24)
GFR, Estimated: 32 mL/min — ABNORMAL LOW (ref >60)
Glucose, Bld: 120 mg/dL — ABNORMAL HIGH (ref 70–99)
Potassium: 5 mmol/L (ref 3.5–5.1)
Sodium: 135 mmol/L (ref 135–145)
Total Bilirubin: 0.4 mg/dL (ref 0.0–1.2)
Total Protein: 7.1 g/dL (ref 6.5–8.1)

## 2023-06-29 LAB — CREATININE, URINE, RANDOM: Creatinine, Urine: 54 mg/dL

## 2023-06-29 LAB — MAGNESIUM: Magnesium: 1.7 mg/dL (ref 1.7–2.4)

## 2023-06-29 LAB — TROPONIN I (HIGH SENSITIVITY)
Troponin I (High Sensitivity): 7 ng/L (ref <18)
Troponin I (High Sensitivity): 9 ng/L (ref <18)

## 2023-06-29 LAB — CK: Total CK: 253 U/L (ref 49–397)

## 2023-06-29 LAB — HIV ANTIBODY (ROUTINE TESTING W REFLEX): HIV Screen 4th Generation wRfx: NONREACTIVE

## 2023-06-29 MED ORDER — RISPERIDONE 1 MG PO TABS
1.0000 mg | ORAL_TABLET | Freq: Two times a day (BID) | ORAL | Status: DC
  Administered 2023-06-29 – 2023-06-30 (×3): 1 mg via ORAL
  Filled 2023-06-29 (×6): qty 1

## 2023-06-29 MED ORDER — BUPRENORPHINE HCL 8 MG SL SUBL
8.0000 mg | SUBLINGUAL_TABLET | Freq: Three times a day (TID) | SUBLINGUAL | Status: DC
  Administered 2023-06-29 – 2023-06-30 (×3): 8 mg via SUBLINGUAL
  Filled 2023-06-29 (×3): qty 1

## 2023-06-29 MED ORDER — LACTATED RINGERS IV BOLUS
1000.0000 mL | Freq: Once | INTRAVENOUS | Status: AC
  Administered 2023-06-29: 1000 mL via INTRAVENOUS

## 2023-06-29 MED ORDER — HYDROCODONE-ACETAMINOPHEN 5-325 MG PO TABS
1.0000 | ORAL_TABLET | Freq: Once | ORAL | Status: AC
  Administered 2023-06-29: 1 via ORAL
  Filled 2023-06-29: qty 1

## 2023-06-29 MED ORDER — HEPARIN SODIUM (PORCINE) 5000 UNIT/ML IJ SOLN
5000.0000 [IU] | Freq: Three times a day (TID) | INTRAMUSCULAR | Status: DC
  Administered 2023-06-29 – 2023-06-30 (×3): 5000 [IU] via SUBCUTANEOUS
  Filled 2023-06-29 (×3): qty 1

## 2023-06-29 MED ORDER — METHOCARBAMOL 500 MG PO TABS
500.0000 mg | ORAL_TABLET | Freq: Three times a day (TID) | ORAL | Status: DC
  Administered 2023-06-29 (×2): 500 mg via ORAL
  Filled 2023-06-29 (×2): qty 1

## 2023-06-29 MED ORDER — PANTOPRAZOLE SODIUM 40 MG PO TBEC
40.0000 mg | DELAYED_RELEASE_TABLET | Freq: Every day | ORAL | Status: DC
  Administered 2023-06-29 – 2023-06-30 (×2): 40 mg via ORAL
  Filled 2023-06-29 (×2): qty 1

## 2023-06-29 MED ORDER — LIDOCAINE 5 % EX PTCH
1.0000 | MEDICATED_PATCH | CUTANEOUS | Status: DC
  Administered 2023-06-29: 1 via TRANSDERMAL
  Filled 2023-06-29: qty 1

## 2023-06-29 MED ORDER — ACETAMINOPHEN 500 MG PO TABS
1000.0000 mg | ORAL_TABLET | Freq: Three times a day (TID) | ORAL | Status: DC
  Administered 2023-06-29 – 2023-06-30 (×2): 1000 mg via ORAL
  Filled 2023-06-29 (×2): qty 2

## 2023-06-29 NOTE — Hospital Course (Addendum)
 Brian Hobbs is a 43 year-old male with PMH of HTN, ADHD, GERD, low testosterone, alcohol use disorder, and opioid use disorder who presents for kidney dysfunction and was admitted on 06/29/2023 for AKI.   #AKI Patient presented after workup in ED for chest pain was remarkable for elevated creatinine (6.17-> 2.47) and elevated BUN (68->47). Prerenal AKI was suspected since patient appeared dehydrated on exam with dry mucous membranes, prolonged capillary refill, and mild skin tenting. 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 His labs improved (creatinine 1.67, BUN 33) after receiving 1L LR bolus x5 since initial presentation to [facility] ED. Intrinsic AKI seemed less likely since ALW:rmzjupwpwz ratio >15:1 (19:1), UA without RBCs and minimal WBCs, and no concerns for rhabdomyolysis (normal CK) or contrast-induced nephropathy (no recent IV contrast). Post-renal AKI was very unlikely since CTAP without signs of hydronephrosis or nephrolithiasis.    #Shoulder pain Patient originally presented for sharp chest pain radiating down his left arm. Cardiac workup was reassuring with down-trending troponin (73->62->7->9) and normal sinus rhythm on EKG. Suspect pain was actually originating from his left shoulder since he was tender to palpation around the coracoid process and deltoid on exam. Shoulder XR without osseous abnormalities. His pain was managed with scheduled Tylenol , Robaxin , and lidocaine  patches.    #HTN Blood pressure remained normal to slightly elevated while hospitalized, ranging between SBPs 91-143. His home valsartan and hydrochlorothiazide were held during hospitalization and at discharge given AKI. Medications can be resumed by PCP if BMP stable when checked during new patient appointment. Patient instructed to take clonidine PRN for SBP >160 in the meantime.   #Alcohol use disorder #Opioid  use disorder Patient reported drinking 1 pint of liquor at least 3 nights per week on admission. Since he was hospitalized in 03/2021 for alcohol withdrawal with delirium tremens, his CIWA scores were monitored while admitted without concerns. His home buprenorphine  8 mg TID was continued while hospitalized.   #ADHD #Hallucinations Patient is prescribed Adderall 20 mg QID for ADHD. His home Adderall dose was held during hospitalization to prevent hypertension while antihypertensive medications were held for AKI. Patient is also prescribed risperidone  1 mg BID for auditory and visual hallucinations, which was continued while hospitalized.   #PCP needs Patient did not have a primary care provider prior to admission, so his medications were refilled by various providers. TOC was consulted, but patient refused their assistance. Therefore, patient was scheduled for a one-time hospital follow-up with the internal medicine center on 7/1 to provide him more time to find and establish with a PCP.   #GERD Patient was continued on pantoprazole  40 mg daily in replacement for his home esomeprazole 20 mg daily.

## 2023-06-29 NOTE — ED Notes (Signed)
 RT and another RN notice bradycardia on monitor and went to check on patient. When they arrived to the room the patient was awake & alert. This RN arrived to the room and the patient asked when he was having more test done and when he could leave. Pt was given update on how the process works but insisted on leaving. This RN went to get the ER MD. MD updated patient on the risk of leaving the ER. Pt states he can just go to another hospital on his own. This RN explain that he would have to start this entire process over again if he leaves the hospital. ER MD and RN tried to explain to the patient the importance of staying but patient insisted on leaving. Pt signed AMA form and left. The IV was removed.

## 2023-06-29 NOTE — Plan of Care (Signed)

## 2023-06-29 NOTE — ED Triage Notes (Signed)
 Pt states he went to [facility] yesterday for CP. Pt states doctor stated he was in renal failure and decided to leave. Pt states he came to be admitted for kidneys. Denies SHOB.

## 2023-06-29 NOTE — ED Provider Notes (Signed)
 I was notified by the RN, that this patient was requesting to leave.  He is currently here in renal failure awaiting admission and for a bed to become available.  Patient informed the nurse that he wanted to go home.  I went to the patient's room to discuss the situation with him.  He appeared somewhat agitated that he was still in the ER and felt nothing was being done for him.  I explained that he was in renal failure and that leaving the hospital could cause worsening renal failure and ultimately dialysis.  Patient stated that he was going to another hospital and demanded to leave.  He then signed out AGAINST MEDICAL ADVICE and left the facility.  My conversation with the patient was witnessed by Clarita Snowman, RN   Geroldine Berg, MD 06/29/23 [phone]

## 2023-06-29 NOTE — ED Provider Notes (Signed)
 [facility] AT [facility] Provider Note   CSN: [id number] Arrival date & time: 06/29/23  9163     Patient presents with: Chest Pain   Brian Hobbs is a 44 y.o. male.   HPI   43 year old male with past medical history of HTN presents to the emergency department with concern for kidney dysfunction.  [facility].  Workup was concerning for kidney failure.  Patient was originally admitted however got frustrated with the wait time overnight and not receiving food so he left AGAINST MEDICAL ADVICE this morning and came here for evaluation.  Patient is a Corporate investment banker, admits to hard labor outside but states that he drinks profuse amount of water , has been urinating.  Denies any injury.  Takes his medications as prescribed, no new medications.  No recent fever or other acute symptoms.  Today he still has ongoing left shoulder/chest pain but states that it is improved.  Prior to Admission medications   Medication Sig Start Date End Date Taking? Authorizing Provider  amphetamine -dextroamphetamine (ADDERALL XR) 20 MG 24 hr capsule Take 20 mg by mouth 3 (three) times daily. 03/03/21   [provider]  amphetamine -dextroamphetamine (ADDERALL) 20 MG tablet Take 20 mg by mouth 4 (four) times daily.    [provider]  anastrozole (ARIMIDEX) 1 MG tablet Take 1 mg by mouth every 28 (twenty-eight) days. 05/12/23   [provider]  buprenorphine  (SUBUTEX ) 8 MG SUBL SL tablet Place 8 mg under the tongue 3 (three) times daily.    [provider]  Buprenorphine  HCl-Naloxone  HCl 8-2 MG FILM Place under the tongue 3 (three) times daily. 03/03/21   [provider]  cloNIDine (CATAPRES) 0.1 MG tablet Take 0.1 mg by mouth 2 (two) times daily as needed. 03/13/23   [provider]  diphenhydrAMINE  (BENADRYL ) 25 mg capsule Take 1 capsule (25 mg total) by mouth every 6  (six) hours as needed for itching. Patient not taking: Reported on 03/05/2021 07/13/11 07/23/11  Kirichenko, Tatyana, PA-C  famotidine  (PEPCID ) 20 MG tablet Take 1 tablet (20 mg total) by mouth 2 (two) times daily. 07/13/11 07/12/12  Kirichenko, Tatyana, PA-C  gabapentin  (NEURONTIN ) 300 MG capsule Take 300 mg by mouth 3 (three) times daily.    [provider]  hydrochlorothiazide (HYDRODIURIL) 25 MG tablet Take 25 mg by mouth daily. 06/09/23   [provider]  Iodine, Kelp, 0.15 MG TABS Take 1 tablet by mouth daily.    [provider]  predniSONE  (DELTASONE ) 10 MG tablet Take 6 tab day 1, 5 tab day 2, 4 tab day 3, 3 tab day 4, 2 tab day 5, 1 tab day 6. Take PO Patient not taking: Reported on 03/05/2021 07/13/11   Kirichenko, Tatyana, PA-C  risperiDONE (RISPERDAL) 1 MG tablet Take 1 mg by mouth 2 (two) times daily.    [provider]  sildenafil (VIAGRA) 50 MG tablet Take 50 mg by mouth daily as needed.    [provider]  testosterone cypionate (DEPOTESTOSTERONE CYPIONATE) 200 MG/ML injection Inject 200 mg into the muscle every 7 (seven) days. 02/23/21   [provider]  valsartan (DIOVAN) 80 MG tablet Take by mouth. 04/13/22   [provider]  zinc gluconate 50 MG tablet Take 50 mg by mouth daily.    [provider]    Allergies: Nsaids    Review of Systems  Constitutional:  Negative for fever.  Respiratory:  Negative for shortness of breath.   Cardiovascular:  Negative for chest pain.  Gastrointestinal:  Negative for abdominal pain, diarrhea and vomiting.  Genitourinary:  Negative for difficulty urinating and flank pain.  Musculoskeletal:        + left shoulder pain  Skin:  Negative for rash.  Neurological:  Negative for headaches.    Updated Vital Signs BP 114/64 (BP Location: Left Arm)   Pulse 91   Temp 97.9 F (36.6 C) (Oral)   Resp 16   Ht 6' 2 (1.88 m)   Wt 136.1 kg   SpO2 99%   BMI 38.52 kg/m   Physical  Exam Vitals and nursing note reviewed.  Constitutional:      General: He is not in acute distress.    Appearance: Normal appearance.  HENT:     Head: Normocephalic.     Mouth/Throat:     Mouth: Mucous membranes are moist.   Cardiovascular:     Rate and Rhythm: Normal rate.  Pulmonary:     Effort: Pulmonary effort is normal. No respiratory distress.  Abdominal:     Palpations: Abdomen is soft.     Tenderness: There is no abdominal tenderness.   Musculoskeletal:     Right lower leg: No edema.     Left lower leg: No edema.     Comments: TTP of left shoulder, full ROM, nv in tact   Skin:    General: Skin is warm.   Neurological:     Mental Status: He is alert and oriented to person, place, and time. Mental status is at baseline.   Psychiatric:        Mood and Affect: Mood normal.     (all labs ordered are listed, but only abnormal results are displayed) Labs Reviewed  COMPREHENSIVE METABOLIC PANEL WITH GFR - Abnormal; Notable for the following components:      Result Value   Glucose, Bld 120 (*)    BUN 47 (*)    Creatinine, Ser 2.47 (*)    GFR, Estimated 32 (*)    All other components within normal limits  URINALYSIS, ROUTINE W REFLEX MICROSCOPIC - Abnormal; Notable for the following components:   Color, Urine STRAW (*)    Glucose, UA 50 (*)    Hgb urine dipstick SMALL (*)    Bacteria, UA RARE (*)    All other components within normal limits  CBC WITH DIFFERENTIAL/PLATELET  MAGNESIUM  CK  TROPONIN I (HIGH SENSITIVITY)  TROPONIN I (HIGH SENSITIVITY)    EKG: EKG Interpretation Date/Time:  Thursday June 29 2023 09:49:49 EDT Ventricular Rate:  98 PR Interval:  142 QRS Duration:  96 QT Interval:  306 QTC Calculation: 391 R Axis:   77  Text Interpretation: Sinus rhythm Minimal ST depression, inferior leads Confirmed by Bari Flank [phone]) on 06/29/2023 10:20:55 AM  Radiology: ARCOLA Shoulder Left Result Date: 06/29/2023 CLINICAL DATA:  Patient's is detained  to be admitted for kidneys. EXAM: LEFT SHOULDER - 2+ VIEW COMPARISON:  None Available. FINDINGS: There is no evidence of an acute fracture or dislocation. Chronic flattening of the greater tubercle of the left humeral head is noted. There is no evidence of arthropathy or other focal bone abnormality. Soft tissues are unremarkable. IMPRESSION: No acute osseous abnormality. Electronically Signed   By: Suzen Dials M.D.   On: 06/29/2023 10:08   CT ABDOMEN PELVIS WO CONTRAST Result Date: 06/28/2023 CLINICAL DATA:  Abdominal pain. EXAM: CT ABDOMEN AND PELVIS WITHOUT CONTRAST TECHNIQUE: Multidetector CT imaging  of the abdomen and pelvis was performed following the standard protocol without IV contrast. RADIATION DOSE REDUCTION: This exam was performed according to the departmental dose-optimization program which includes automated exposure control, adjustment of the mA and/or kV according to patient size and/or use of iterative reconstruction technique. COMPARISON:  None Available. FINDINGS: Evaluation of this exam is limited in the absence of intravenous contrast. Lower chest: The visualized lung bases are clear. No intra-abdominal free air or free fluid. Hepatobiliary: No focal liver abnormality is seen. No gallstones, gallbladder wall thickening, or biliary dilatation. Pancreas: Unremarkable. No pancreatic ductal dilatation or surrounding inflammatory changes. Spleen: Normal in size without focal abnormality. Adrenals/Urinary Tract: The adrenal glands unremarkable there is no hydronephrosis or nephrolithiasis on either side. The visualized ureters and urinary bladder appear unremarkable Stomach/Bowel: There is no bowel obstruction or active inflammation. The appendix is normal. Vascular/Lymphatic: The abdominal aorta and IVC unremarkable. No free gas. There is no adenopathy. Reproductive: The prostate and seminal vesicles are grossly unremarkable. No pelvic mass. Other: None Musculoskeletal: Degenerative  changes of the spine. No acute osseous pathology. IMPRESSION: 1. No acute intra-abdominal or pelvic pathology. No hydronephrosis or nephrolithiasis. 2. No bowel obstruction. Normal appendix. Electronically Signed   By: Vanetta Chou M.D.   On: 06/28/2023 15:52   DG Chest 2 View Result Date: 06/28/2023 CLINICAL DATA:  Chest pain for 3 days. EXAM: CHEST - 2 VIEW COMPARISON:  None available currently. FINDINGS: The heart size and mediastinal contours are within normal limits. Both lungs are clear. The visualized skeletal structures are unremarkable. IMPRESSION: No active cardiopulmonary disease. Electronically Signed   By: Lynwood Landy Raddle M.D.   On: 06/28/2023 14:13     Procedures   Medications Ordered in the ED  HYDROcodone-acetaminophen  (NORCO/VICODIN) 5-325 MG per tablet 1 tablet (1 tablet Oral Given 06/29/23 1032)  lactated ringers  bolus 1,000 mL (1,000 mLs Intravenous New Bag/Given 06/29/23 1245)                                    Medical Decision Making Amount and/or Complexity of Data Reviewed Labs: ordered. Radiology: ordered.  Risk Prescription drug management. Decision regarding hospitalization.   44 year old male presents emergency department after being evaluated at an outside facility for chest pain, found to be in renal failure.  Initially admitted but left AGAINST MEDICAL ADVICE.  Returns for reevaluation.  Patient continues to have left-sided shoulder pain but improved.  Otherwise continues to make urine.  Was given IV hydration yesterday at his visit.  Vitals are normal and stable on arrival.  Blood work shows that the creatinine has significantly improved from above 6 to 2.47.  Troponin has normalized and CK is normal.  Electrolytes are normal and remainder of the blood work is reassuring.  Yesterday CT imaging was done that showed no structural abnormality of the renal system.  Consulted with nephrology, Dr. Rayburn.  Given that we do not have a specific reason  for this acute kidney failure, however with improvement.  Believe the patient would benefit from admission, IV hydration, kidney function monitoring and medication adjustments.  Patient is agreeable to admission.  Patients evaluation and results requires admission for further treatment and care.  Spoke with hospitalist, reviewed patient's ED course and they accept admission.  Patient agrees with admission plan, offers no new complaints and is stable/unchanged at time of admit.     Final diagnoses:  AKI (acute kidney injury) (  [facility])    ED Discharge Orders     None          Bari Roxie HERO, DO 06/29/23 1256

## 2023-06-29 NOTE — H&P (Signed)
 Date: 06/29/2023               Patient Name:  Brian Hobbs MRN: [id number]  DOB: 28-Nov-1980 Age / Sex: 43 y.o., male   PCP: Pcp, No              Medical Service: Internal Medicine Teaching Service              Attending Physician: Dr. Eben Reyes BROCKS, MD    First Contact: Vernell Music, MS4    Second Contact: Dr. Drue Grow    Third Contact Dr. Saad Nooruddin         After Hours (After 5p/  First Contact Pager: [phone]  weekends / holidays): Second Contact Pager: [phone]   Chief Complaint: kidney dysfunction  History of Present Illness:  Brian Hobbs is a 43 year-old male with PMH of HTN, ADHD, GERD, low testosterone, alcohol use disorder, and opioid use disorder who presents for kidney dysfunction.  Patient presented to [facility] ED yesterday (6/25) for chest pain. He noticed the chest pain upon waking 3 days ago (6/23). The chest pain is located near his coracoid process and radiates down his left arm to the elbow. He describes the pain as sharp and muscular. The pain improves with certain arm movements like propping it up on the bedside table and worsens with other arm movements. He denies any trauma/injury to his shoulder. When the pain failed to improve at home, he decided to present to the ED for evaluation. During workup at the ED, he was told he was being admitted for renal failure; however, he left AMA since they would not provide anything to eat or drink.  Patient then presented to [facility] ED today (6/26) due to concerns for kidney dysfunction. He is largely asymptomatic but reports somewhat darker urine with decreased urine production yesterday, which has since resolved. His appetite and fluid intake have been normal. 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 He denies any history of kidney disease. He also denies headaches, nasal congestion, cough, shortness of breath, nausea,  vomiting, palpitations, abdominal pain, constipation, diarrhea, dysuria, hematuria, or lower extremity edema.  Upon presentation to [facility] ED, patient was hypertensive (BP 141/76) and tachycardic (HR 112) with otherwise normal vital signs on room air. Workup notable for elevated creatinine (6.17-> 2.47), elevated BUN (68->47), normal CK (253), unremarkable HFP, down-trending troponin (73->62->7->9), normal Mg (1.7), unremarkable CBC, UA with 50 glucose, small Hgb, rare bacteria, 6-10 WBC, CTAP with no acute intra-abdominal or pelvic pathology, no hydronephrosis or nephrolithiasis, shoulder XR with no acute osseous abnormality, and EKG NSR. He received Norco x 1 and 1L LR bolus x1.   Meds: Current Meds  Medication Sig   acetaminophen  (TYLENOL ) 500 MG tablet Take 1,000 mg by mouth 2 (two) times daily as needed for moderate pain (pain score 4-6), fever or headache.   amphetamine -dextroamphetamine (ADDERALL) 20 MG tablet Take 20 mg by mouth 4 (four) times daily.   anastrozole (ARIMIDEX) 1 MG tablet Take 1 mg by mouth every Sunday.   buprenorphine  (SUBUTEX ) 8 MG SUBL SL tablet Place 8 mg under the tongue 3 (three) times daily.   cloNIDine (CATAPRES) 0.1 MG tablet Take 0.1 mg by mouth 2 (two) times daily.   Esomeprazole Magnesium (NEXIUM 24HR) 20 MG TBEC Take 20 mg by mouth daily.   hydrochlorothiazide (HYDRODIURIL) 25 MG tablet Take 25 mg by mouth daily.   risperiDONE (RISPERDAL)  1 MG tablet Take 1 mg by mouth 2 (two) times daily.   sildenafil (VIAGRA) 50 MG tablet Take 50 mg by mouth daily as needed.   testosterone cypionate (DEPOTESTOSTERONE CYPIONATE) 200 MG/ML injection Inject 200 mg into the muscle every Sunday.   valsartan (DIOVAN) 80 MG tablet Take 80-160 mg by mouth See admin instructions. Take 2 tablets (160mg ) by mouth every morning and 1 tablet (80mg ) every evening.    Allergies: Allergies as of 06/29/2023 - Review Complete 06/29/2023  Allergen Reaction Noted   Nsaids Other (See  Comments) 12/12/2022   Past Medical History:  Diagnosis Date   Hypertension     Family History:  - Mother: fibromyalgia - Father: heart disease - Brother: obesity-associated conditions  Social History:  Patient lives by himself in [place], [place]. 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 He is independent with ADLs/iADLs. He reports vaping daily and drinking 1 pint of liquor three times weekly. He denies any recreational drug use. His emergency contact is Daemien Fronczak (mother: [phone]).   Review of Systems: A complete ROS was negative except as per HPI.  Physical Exam: Blood pressure 114/64, pulse 91, temperature 97.9 F (36.6 C), temperature source Oral, resp. rate 16, height 6' 2 (1.88 m), weight 136.1 kg, SpO2 99%. General: Well-appearing obese male lying comfortably in hospital bed while watching TV, in NAD HENT: Normocephalic and atraumatic. No nasal congestion or rhinorrhea. Oropharynx without lesions, erythema, or exudate. Poor dentition. Dry mucous membranes. Eyes: Conjunctivae clear. EOMI. PERRL.  Cardiovascular: Normal rate with regular rhythm. No murmurs, rubs, or gallops. Normal radial and PT pulses bilaterally. No LE edema.  Pulmonary: Lungs CTAB. No wheezes, rales, or rhonchi. Normal respiratory effort on room air. Abdominal: Soft. Non-distended. No tenderness to palpation. Normal bowel sounds.  Musculoskeletal: Left chest/shoulder tender to palpation around coracoid process and deltoid.  Neurological: Alert, responds appropriately to questions.  Skin: Warm and dry. Acanthosis nigricans on posterior neck. Cap refill 3-4 seconds. Mild skin tenting.  EKG: personally reviewed my interpretation is NSR  Assessment & Plan by Problem: Principal Problem:   Acute kidney injury (HCC)  Brian Hobbs is a 43 year-old male with PMH of HTN, ADHD, GERD, low testosterone, alcohol use disorder, and opioid use disorder who  presents for kidney dysfunction and was admitted on 06/29/2023 for AKI.  #AKI Patient presents after workup in ED for chest pain was remarkable for elevated creatinine (6.17-> 2.47) and elevated BUN (68->47). Suspect prerenal AKI since patient appears dehydrated on exam with dry mucous membranes, prolonged capillary refill, and mild skin tenting. 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 His ALW:rmzjupwpwz ratio is currently equivocal (19:1), so will obtain urine creatinine and urine sodium to calculate the FENa. Will also give additional 1L fluid bolus for now, but he will probably need more fluids. Intrinsic AKI seems less likely since ALW:rmzjupwpwz ratio >15:1 (19:1), urinalysis reassuring against glomerulonephritis (no RBCs) and acute interstitial nephritis (6-10 WBCs), and no concerns for rhabdomyolysis (normal CK) or contrast-induced nephropathy (no recent IV contrast). Post-renal AKI is unlikely since CTAP without signs of hydronephrosis or nephrolithiasis. Will not order renal/bladder ultrasound since CTAP was already completed in the ED. - Nephrology consulted; appreciate recs - s/p 1L LR bolus x3 in ED - Ordered additional 1L LR bolus - Follow-up urine sodium and creatinine - Repeat RFP tomorrow AM - Avoid nephrotoxic  agents  #Shoulder pain Patient originally presented for sharp chest pain radiating down his left arm. Cardiac workup reassuring with down-trending troponin (73->62->7->9) and normal sinus rhythm on EKG. Suspect pain is actually originating from his left shoulder since he is tender to palpation around coracoid process and deltoid on exam. Will have PT evaluate. Will also manage pain with scheduled Tylenol , Robaxin, and lidocaine patches. Would like to avoid opioids and NSAIDs if possible. [facility] Tylenol  1000 mg Q8H - SCH Robaxin 500 mg TID - Lidocaine patch Q24H - PT consult - Avoid  NSAIDs and opioids  #HTN Blood pressure has been normal to slightly elevated in the ED, ranging between SBPs 114-143. Will hold home valsartan and hydrochlorothiazide given AKI. Can consider PRN antihypertensive like hydralazine or labetalol if SBPs in 170-180s. - Hold home valsartan 80 mg BID - Hold home hydrochlorothiazide 25 mg daily - Hold home clonidine 0.1 mg BID PRN (not used recently)  #Alcohol use disorder #Opioid use disorder Patient currently drinks 1 pint of liquor at least 3 nights per week. Since he was hospitalized in 03/2021 for alcohol withdrawal with delirium tremens, will monitor CIWA scores while admitted. He denies recent recreational drug use, including heroin. Will continue his home buprenorphine  while hospitalized. - Home buprenorphine  8 mg TID - CIWA scores  #ADHD #Hallucinations Patient has been taking Adderall for ADHD for a very long time. Do not think Adderall is necessary while admitted, so will hold for now. He also takes risperidone for auditory and visual hallucinations, so will continue home dose while hospitalized.  - Home risperidone 1 mg BID - Hold home Adderall 20 mg QID  #PCP needs Patient does not currently have a primary care provider, so his medications are refilled by various providers. He needs a PCP to monitor his kidney function after discharge and to complete health maintenance screens (lipids, HgbA1c, etc.). - TOC Consult  #GERD - Pantoprazole  40 mg daily (replaces home esomeprazole 20 mg daily)  Dispo: Admit patient to Observation with expected length of stay less than 2 midnights.  Signed: Koleen Vernell BRAVO, Medical Student 06/29/2023, 3:00 PM

## 2023-06-30 ENCOUNTER — Other Ambulatory Visit (HOSPITAL_COMMUNITY): Payer: Self-pay

## 2023-06-30 LAB — GC/CHLAMYDIA PROBE AMP (~~LOC~~) NOT AT ARMC
Chlamydia: NEGATIVE
Comment: NEGATIVE
Comment: NORMAL
Neisseria Gonorrhea: NEGATIVE

## 2023-06-30 LAB — BASIC METABOLIC PANEL WITH GFR
Anion gap: 9 (ref 5–15)
BUN: 30 mg/dL — ABNORMAL HIGH (ref 6–20)
CO2: 26 mmol/L (ref 22–32)
Calcium: 9.5 mg/dL (ref 8.9–10.3)
Chloride: 100 mmol/L (ref 98–111)
Creatinine, Ser: 1.65 mg/dL — ABNORMAL HIGH (ref 0.61–1.24)
GFR, Estimated: 53 mL/min — ABNORMAL LOW (ref >60)
Glucose, Bld: 115 mg/dL — ABNORMAL HIGH (ref 70–99)
Potassium: 4.8 mmol/L (ref 3.5–5.1)
Sodium: 135 mmol/L (ref 135–145)

## 2023-06-30 LAB — COMPREHENSIVE METABOLIC PANEL WITH GFR
ALT: 23 U/L (ref 0–44)
AST: 21 U/L (ref 15–41)
Albumin: 3.5 g/dL (ref 3.5–5.0)
Alkaline Phosphatase: 45 U/L (ref 38–126)
Anion gap: 9 (ref 5–15)
BUN: 33 mg/dL — ABNORMAL HIGH (ref 6–20)
CO2: 26 mmol/L (ref 22–32)
Calcium: 9.5 mg/dL (ref 8.9–10.3)
Chloride: 100 mmol/L (ref 98–111)
Creatinine, Ser: 1.67 mg/dL — ABNORMAL HIGH (ref 0.61–1.24)
GFR, Estimated: 52 mL/min — ABNORMAL LOW (ref >60)
Glucose, Bld: 102 mg/dL — ABNORMAL HIGH (ref 70–99)
Potassium: 4.9 mmol/L (ref 3.5–5.1)
Sodium: 135 mmol/L (ref 135–145)
Total Bilirubin: 0.6 mg/dL (ref 0.0–1.2)
Total Protein: 7.1 g/dL (ref 6.5–8.1)

## 2023-06-30 LAB — CBC
HCT: 41.9 % (ref 39.0–52.0)
Hemoglobin: 13.9 g/dL (ref 13.0–17.0)
MCH: 30 pg (ref 26.0–34.0)
MCHC: 33.2 g/dL (ref 30.0–36.0)
MCV: 90.5 fL (ref 80.0–100.0)
Platelets: 337 10*3/uL (ref 150–400)
RBC: 4.63 MIL/uL (ref 4.22–5.81)
RDW: 13.2 % (ref 11.5–15.5)
WBC: 6.7 10*3/uL (ref 4.0–10.5)
nRBC: 0 % (ref 0.0–0.2)

## 2023-06-30 LAB — RPR: RPR Ser Ql: NONREACTIVE

## 2023-06-30 MED ORDER — METHOCARBAMOL 750 MG PO TABS
750.0000 mg | ORAL_TABLET | Freq: Three times a day (TID) | ORAL | Status: DC
  Administered 2023-06-30: 750 mg via ORAL
  Filled 2023-06-30: qty 1

## 2023-06-30 MED ORDER — LACTATED RINGERS IV BOLUS
1000.0000 mL | Freq: Once | INTRAVENOUS | Status: AC
  Administered 2023-06-30: 1000 mL via INTRAVENOUS

## 2023-06-30 MED ORDER — METHOCARBAMOL 750 MG PO TABS
750.0000 mg | ORAL_TABLET | Freq: Three times a day (TID) | ORAL | 0 refills | Status: DC
  Filled 2023-06-30: qty 15, 5d supply, fill #0

## 2023-06-30 NOTE — Plan of Care (Signed)

## 2023-06-30 NOTE — Discharge Summary (Signed)
 Name: Brian Hobbs MRN: [id number] DOB: 07-Apr-1980 43 y.o. PCP: Pcp, No  Date of Admission: 06/29/2023  8:36 AM Date of Discharge:  06/30/2023 Attending Physician: Dr. Eben  DISCHARGE DIAGNOSIS:  Primary Problem: Acute kidney injury [facility])   Hospital Problems: Principal Problem:   Acute kidney injury (HCC)    DISCHARGE MEDICATIONS:   Allergies as of 06/30/2023       Reactions   Nsaids Other (See Comments)   Not supposed to take due to BP med        Medication List     PAUSE taking these medications    hydrochlorothiazide 25 MG tablet Wait to take this until your doctor or other care provider tells you to start again. Commonly known as: HYDRODIURIL Take 25 mg by mouth daily.   valsartan 80 MG tablet Wait to take this until your doctor or other care provider tells you to start again. Commonly known as: DIOVAN Take 80-160 mg by mouth See admin instructions. Take 2 tablets (160mg ) by mouth every morning and 1 tablet (80mg ) every evening.       TAKE these medications    acetaminophen  500 MG tablet Commonly known as: TYLENOL  Take 1,000 mg by mouth 2 (two) times daily as needed for moderate pain (pain score 4-6), fever or headache.   amphetamine -dextroamphetamine 20 MG tablet Commonly known as: ADDERALL Take 20 mg by mouth 4 (four) times daily.   anastrozole 1 MG tablet Commonly known as: ARIMIDEX Take 1 mg by mouth every Sunday.   buprenorphine  8 MG Subl SL tablet Commonly known as: SUBUTEX  Place 8 mg under the tongue 3 (three) times daily.   cloNIDine 0.1 MG tablet Commonly known as: CATAPRES Take 0.1 mg by mouth 2 (two) times daily.   methocarbamol  750 MG tablet Commonly known as: ROBAXIN  Take 1 tablet (750 mg total) by mouth 3 (three) times daily.   NexIUM 24HR 20 MG Tbec Generic drug: Esomeprazole Magnesium Take 20 mg by mouth daily.   risperiDONE  1 MG tablet Commonly known as: RISPERDAL  Take 1 mg by mouth 2 (two) times daily.    sildenafil 50 MG tablet Commonly known as: VIAGRA Take 50 mg by mouth daily as needed.   testosterone cypionate 200 MG/ML injection Commonly known as: DEPOTESTOSTERONE CYPIONATE Inject 200 mg into the muscle every Sunday.        DISPOSITION AND FOLLOW-UP:  [name] was discharged from [facility] in stable condition. At the hospital follow up visit please address:  Follow-up Recommendations: []  Repeat BMP to monitor kidney function and creatinine []  Restart valsartan 80 mg BID and hydrochlorothiazide 25 mg daily if kidney function stable  Follow-up Appointments: - One-time hospital follow-up: [facility] on 07/04/2023 at 2:45PM   HOSPITAL COURSE:  Patient Summary: Brian Hobbs is a 43 year-old male with PMH of HTN, ADHD, GERD, low testosterone, alcohol use disorder, and opioid use disorder who presents for kidney dysfunction and was admitted on 06/29/2023 for AKI.   #AKI Patient presented after workup in ED for chest pain was remarkable for elevated creatinine (6.17-> 2.47) and elevated BUN (68->47). Prerenal AKI was suspected since patient appeared dehydrated on exam with dry mucous membranes, prolonged capillary refill, and mild skin tenting. 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 His labs improved (creatinine 1.67, BUN 33) after receiving 1L LR bolus x5 since initial presentation to [facility]  ED. Intrinsic AKI seemed less likely since ALW:rmzjupwpwz ratio >15:1 (19:1), UA without RBCs and minimal WBCs, and no concerns for rhabdomyolysis (normal CK) or contrast-induced nephropathy (no recent IV contrast). Post-renal AKI was very unlikely since CTAP without signs of hydronephrosis or nephrolithiasis.    #Shoulder pain Patient originally presented for sharp chest pain radiating down his left arm. Cardiac workup was reassuring  with down-trending troponin (73->62->7->9) and normal sinus rhythm on EKG. Suspect pain was actually originating from his left shoulder since he was tender to palpation around the coracoid process and deltoid on exam. Shoulder XR without osseous abnormalities. His pain was managed with scheduled Tylenol , Robaxin , and lidocaine  patches.    #HTN Blood pressure remained normal to slightly elevated while hospitalized, ranging between SBPs 91-143. His home valsartan and hydrochlorothiazide were held during hospitalization and at discharge given AKI. Medications can be resumed by PCP if BMP stable when checked during new patient appointment. Patient instructed to take clonidine PRN for SBP >160 in the meantime.   #Alcohol use disorder #Opioid use disorder Patient reported drinking 1 pint of liquor at least 3 nights per week on admission. Since he was hospitalized in 03/2021 for alcohol withdrawal with delirium tremens, his CIWA scores were monitored while admitted without concerns. His home buprenorphine  8 mg TID was continued while hospitalized.   #ADHD #Hallucinations Patient is prescribed Adderall 20 mg QID for ADHD. His home Adderall dose was held during hospitalization to prevent hypertension while antihypertensive medications were held for AKI. Patient is also prescribed risperidone  1 mg BID for auditory and visual hallucinations, which was continued while hospitalized.   #PCP needs Patient did not have a primary care provider prior to admission, so his medications were refilled by various providers. TOC was consulted, but patient refused their assistance. Therefore, patient was scheduled for a one-time hospital follow-up with the internal medicine center on 7/1 to provide him more time to find and establish with a PCP.   #GERD Patient was continued on pantoprazole  40 mg daily in replacement for his home esomeprazole 20 mg daily.   SUBJECTIVE:  No significant overnight events. Patient reports he  is feeling fine this morning. He continues to experience shoulder pain despite scheduled Tylenol  and Robaxin . PT had not evaluated him yet. Patient would like to discharge as soon as possible.  Discharge Vitals:   BP (!) 136/90 (BP Location: Left Arm)   Pulse (!) 105   Temp 98.2 F (36.8 C)   Resp 18   Ht 6' 2 (1.88 m)   Wt 136.1 kg   SpO2 95%   BMI 38.52 kg/m   OBJECTIVE:  General: Well-appearing obese male lying comfortably in hospital bed while watching TV, in NAD HENT: Normocephalic and atraumatic. No nasal congestion or rhinorrhea. Oropharynx without lesions, erythema, or exudate. Poor dentition. MMM. Cardiovascular: Normal rate with regular rhythm. No murmurs, rubs, or gallops. Pulmonary: Normal respiratory effort on room air. Abdominal: Soft. Non-distended. No tenderness to palpation. Normal bowel sounds.  Musculoskeletal: Left chest/shoulder tender to palpation around coracoid process and deltoid.  Neurological: Alert, responds appropriately to questions.  Skin: Warm and dry. Acanthosis nigricans on posterior neck. Cap refill <2 seconds. No skin tenting.  Pertinent Labs, Studies, and Procedures:     Latest Ref Rng & Units 06/30/2023    5:54 AM 06/29/2023   10:12 AM 06/28/2023    1:17 PM  CBC  WBC 4.0 - 10.5 K/uL 6.7  5.8  8.2   Hemoglobin 13.0 - 17.0 g/dL 13.9  14.0  14.0   Hematocrit 39.0 - 52.0 % 41.9  41.1  40.9   Platelets 150 - 400 K/uL 337  312  353        Latest Ref Rng & Units 06/30/2023    5:54 AM 06/29/2023   10:12 AM 06/28/2023    2:00 PM  CMP  Glucose 70 - 99 mg/dL 897  879    BUN 6 - 20 mg/dL 33  47    Creatinine 9.38 - 1.24 mg/dL 8.32  7.52    Sodium 864 - 145 mmol/L 135  135    Potassium 3.5 - 5.1 mmol/L 4.9  5.0    Chloride 98 - 111 mmol/L 100  103    CO2 22 - 32 mmol/L 26  23    Calcium 8.9 - 10.3 mg/dL 9.5  9.3    Total Protein 6.5 - 8.1 g/dL 7.1  7.1  8.3   Total Bilirubin 0.0 - 1.2 mg/dL 0.6  0.4  0.5   Alkaline Phos 38 - 126 U/L 45  54  75    AST 15 - 41 U/L 21  24  29    ALT 0 - 44 U/L 23  23  24      DG Shoulder Left Result Date: 06/29/2023 CLINICAL DATA:  Patient's is detained to be admitted for kidneys. EXAM: LEFT SHOULDER - 2+ VIEW COMPARISON:  None Available. FINDINGS: There is no evidence of an acute fracture or dislocation. Chronic flattening of the greater tubercle of the left humeral head is noted. There is no evidence of arthropathy or other focal bone abnormality. Soft tissues are unremarkable. IMPRESSION: No acute osseous abnormality. Electronically Signed   By: Suzen Dials M.D.   On: 06/29/2023 10:08     Signed: Drue Lisa Grow MD 06/30/2023, 11:30 AM

## 2023-06-30 NOTE — TOC Transition Note (Signed)
 Transition of Care [facility]) - Discharge Note   Patient Details  Name: Brian Hobbs MRN: [id number] Date of Birth: 1980/12/20  Transition of [facility]) CM/SW Contact:  Roxie KANDICE Stain, RN Phone Number: 06/30/2023, 12:23 PM   Clinical Narrative:     MARTEN ILES is stable to discharge home.  No TOC needs at this time.  Final next level of care: Home/Self Care Barriers to Discharge: Barriers Resolved   Patient Goals and CMS Choice Patient states their goals for this hospitalization and ongoing recovery are:: Return home          Discharge Placement             Home          Discharge Plan and Services Additional resources added to the After Visit Summary for                                       Social Drivers of Health (SDOH) Interventions SDOH Screenings   Food Insecurity: No Food Insecurity (06/29/2023)  Housing: Low Risk  (06/29/2023)  Transportation Needs: No Transportation Needs (06/29/2023)  Utilities: Not At Risk (06/29/2023)  Financial Resource Strain: Medium Risk (05/23/2022)   Received from [facility]  Physical Activity: Unknown (05/23/2022)   Received from [facility]  Social Connections: Somewhat Isolated (05/23/2022)   Received from [facility]  Stress: No Stress Concern Present (12/09/2022)   Received from [facility]  Tobacco Use: High Risk (06/29/2023)     Readmission Risk Interventions     No data to display

## 2023-06-30 NOTE — Care Management Obs Status (Signed)
 MEDICARE OBSERVATION STATUS NOTIFICATION   Patient Details  Name: Brian Hobbs MRN: [id number] Date of Birth: 07/23/1980   Medicare Observation Status Notification Given:  Yes   Letter signed and copy given  Claretta Deed 06/30/2023, 10:38 AM

## 2023-06-30 NOTE — TOC CM/SW Note (Signed)
 Transition of Care [facility]) - Inpatient Brief Assessment   Patient Details  Name: Brian Hobbs MRN: [id number] Date of Birth: [birth year]  Transition of Care [facility]) CM/SW Contact:    Roxie KANDICE Stain, RN Phone Number: 06/30/2023, 9:12 AM   Clinical Narrative:  Spoke to patient regarding PCP needs. Patient states he will find his own PCP.  No other TOC needs at this time.  Transition of Care Asessment: Insurance and Status: Insurance coverage has been reviewed Patient has primary care physician: Yes Home environment has been reviewed: safe to discharge home Prior level of function:: independent Prior/Current Home Services: No current home services Social Drivers of Health Review: SDOH reviewed no interventions necessary Readmission risk has been reviewed: Yes Transition of care needs: no transition of care needs at this time

## 2023-06-30 NOTE — Discharge Instructions (Addendum)
 It was a pleasure taking care of you at [facility]! You were admitted for poor kidney function (acute kidney injury) likely from dehydration.  Your kidneys improved after receiving lots of IV fluids. Please continue to drink plenty of water .  Since your kidneys are still recovering, please DO NOT take your blood pressure medications (valsartan and hydrochlorothiazide) for now.  You CAN take your clonidine as needed for blood pressure >160/90.  It is very important that you follow-up in the internal medicine center ([address], [address], [place], [place] [address]) on Tuesday, July 1st at 2:45PM, so your labs can be rechecked before restarting your blood pressure medications.

## 2023-06-30 NOTE — Progress Notes (Signed)
 PT Cancellation Note  Patient Details Name: CALDWELL KRONENBERGER MRN: [id number] DOB: 30-May-1980   Cancelled Treatment:    Reason Eval/Treat Not Completed: PT screened, no needs identified, will sign off (Pt ambulating in room independently. Does report tenderness and pain along L pectoralis major; full shoulder ROM and actually relieved with shoulder flexion. Encouraged ROM as tolerated and open book/doorway stretching).  Aleck Daring, PT, DPT Acute Rehabilitation Services Office [phone]    Alayne ONEIDA Daring 06/30/2023, 10:17 AM

## 2023-07-04 ENCOUNTER — Ambulatory Visit: Admitting: Student

## 2023-07-04 VITALS — BP 138/95 | HR 91 | Temp 98.0°F | Ht 72.0 in | Wt 312.4 lb

## 2023-07-04 DIAGNOSIS — I10 Essential (primary) hypertension: Secondary | ICD-10-CM | POA: Diagnosis not present

## 2023-07-04 DIAGNOSIS — F1190 Opioid use, unspecified, uncomplicated: Secondary | ICD-10-CM

## 2023-07-04 DIAGNOSIS — K219 Gastro-esophageal reflux disease without esophagitis: Secondary | ICD-10-CM

## 2023-07-04 DIAGNOSIS — M7582 Other shoulder lesions, left shoulder: Secondary | ICD-10-CM | POA: Diagnosis not present

## 2023-07-04 DIAGNOSIS — F909 Attention-deficit hyperactivity disorder, unspecified type: Secondary | ICD-10-CM

## 2023-07-04 DIAGNOSIS — N179 Acute kidney failure, unspecified: Secondary | ICD-10-CM

## 2023-07-04 MED ORDER — METHOCARBAMOL 750 MG PO TABS: 750.0000 mg | ORAL_TABLET | Freq: Three times a day (TID) | ORAL | 0 refills | Status: AC

## 2023-07-04 NOTE — Progress Notes (Unsigned)
 Established Patient Office Visit  Subjective   Patient ID: Brian Hobbs, male    DOB: 15-Feb-1980  Age: 43 y.o. MRN: [id number]  Chief Complaint  Patient presents with   Hospitalization Follow-up   Shoulder Pain    HPI  This is a 43 year old male with past medical history of HTN, ADHD, GERD, low testosterone, alcohol use disorder, and opioid use disorder who was recently admitted on 06/29/2023 for AKI, discharged on 06/30/2023.  Patient presents today for hospital follow-up.  Past Medical History:  Diagnosis Date   Hypertension    ROS   As per assessment and plan Objective:     BP (!) 138/95 (BP Location: Left Arm, Patient Position: Sitting, Cuff Size: Normal)   Pulse 91   Temp 98 F (36.7 C) (Oral)   Ht 6' (1.829 m)   Wt (!) 312 lb 6.4 oz (141.7 kg)   SpO2 99%   BMI 42.37 kg/m  BP Readings from Last 3 Encounters:  07/04/23 (!) 138/95  06/30/23 (!) 136/90  06/29/23 (!) 123/92   Wt Readings from Last 3 Encounters:  07/04/23 (!) 312 lb 6.4 oz (141.7 kg)  06/29/23 300 lb (136.1 kg)  03/07/21 280 lb 6.8 oz (127.2 kg)      Physical Exam  General: Sitting in chair, no acute distress Cardiovascular: Regular rate Pulmonary: Breathing comfortably, no wheezing or crackles Abdomen: Soft, nontender, nondistended MSK: localized tenderness noted medial anterior left pectoralis major. ROM intact. 5/5 strength B/l UE.   Results for orders placed or performed in visit on 07/04/23  CMP14 + Anion Gap  Result Value Ref Range   Glucose 104 (H) 70 - 99 mg/dL   BUN 20 6 - 24 mg/dL   Creatinine, Ser 8.48 (H) 0.76 - 1.27 mg/dL   eGFR 58 (L) >40 fO/fpw/8.26   BUN/Creatinine Ratio 13 9 - 20   Sodium 135 134 - 144 mmol/L   Potassium 4.8 3.5 - 5.2 mmol/L   Chloride 97 96 - 106 mmol/L   CO2 24 20 - 29 mmol/L   Anion Gap 14.0 10.0 - 18.0 mmol/L   Calcium 9.6 8.7 - 10.2 mg/dL   Total Protein 7.4 6.0 - 8.5 g/dL   Albumin 4.5 4.1 - 5.1 g/dL   Globulin, Total 2.9 1.5 - 4.5  g/dL   Bilirubin Total 0.2 0.0 - 1.2 mg/dL   Alkaline Phosphatase 76 44 - 121 IU/L   AST 34 0 - 40 IU/L   ALT 42 0 - 44 IU/L    Last metabolic panel Lab Results  Component Value Date   GLUCOSE 104 (H) 07/04/2023   NA 135 07/04/2023   K 4.8 07/04/2023   CL 97 07/04/2023   CO2 24 07/04/2023   BUN 20 07/04/2023   CREATININE 1.51 (H) 07/04/2023   GFRNONAA 53 (L) 06/30/2023   CALCIUM 9.6 07/04/2023   PHOS 4.3 03/06/2021   PROT 7.4 07/04/2023   ALBUMIN 4.5 07/04/2023   LABGLOB 2.9 07/04/2023   BILITOT 0.2 07/04/2023   ALKPHOS 76 07/04/2023   AST 34 07/04/2023   ALT 42 07/04/2023   ANIONGAP 9 06/30/2023      The ASCVD Risk score (Arnett DK, et al., 2019) failed to calculate for the following reasons:   Cannot find a previous HDL lab   Cannot find a previous total cholesterol lab    Assessment & Plan:  Patient is discussed with Dr Lovie   Problem List Items Addressed This Visit  Cardiovascular and Mediastinum   Hypertension   BP Readings from Last 3 Encounters:  07/04/23 (!) 138/95  06/30/23 (!) 136/90  06/29/23 (!) 123/92   Patient was discharged with holding home medications losartan and HCTZ in the setting of AKI.  Patient was sent home with as needed clonidine, which he states that he has not used.  States that he is not taking the losartan and HCTZ as told by the admitted doctors.  Blood pressure in office is elevated to 138/95. -Will check BMP, serum creatinine prior to starting blood pressure medications        Digestive   GERD (gastroesophageal reflux disease)   Home medication consist of esomeprazole 20 mg daily, continue.        Musculoskeletal and Integument   Pectoralis major tendinitis, left   Patient reports that last week Monday, he started to have left-sided chest pain that radiated to his left shoulder and intermittently down to his left forearm.  Described as a sharp pain, that comes and goes, worse at night when he tries to sleep.  Denies  any burning sensation.  No numbness or tingling.  No weakness noted.  Reports that movement makes the pain better.  Reports that he is currently taking Tylenol  500 mg, 4 tablets every 6 hours.  States that he has not taken the Robaxin , it did not help him in the hospital.  During hospitalization, left shoulder x-ray did not show any acute fracture or dislocation.  Showed chronic flattening of the greater tubercular of the left humeral head, no evidence of arthropathy or other focal bony abnormalities.  Patient was offered physical therapy and conservative treatment such as rest, ice, Voltaren gel.  Patient reported that he did not want physical therapy as he cannot do that at his house.  Stated that he wants something stronger like an opiate, patient was advised against opiates.  Patient reported that he has previously tried Flexeril and Robaxin  and it did not help reported that he wants something stronger.  Patient was advised that for muscle pain, Flexeril and Robaxin  are the go to medications.  Patient reported that he will try the Robaxin  and will do exercise at home. -Robaxin -750 mg 3 times daily as needed for pain control -Patient was advised to take Tylenol  500 mg, 2 tablets every 6 hours, max dose of 4000 mg in 24 hours. - Will check LFTs         Genitourinary   AKI (acute kidney injury) [facility]) - Primary   This is a 43 year old male who was recently admitted to the hospital on 06/29/2023 for AKI likely secondary to prerenal, discharged on 06/30/2023.  Patient presents today for for hospital follow-up, reports that he has been drinking plenty of water .  Denies any urinary symptoms.  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   - Will check CMP today  - Encouraged hydration and avoiding nephrotoxic agents       Relevant Orders   CMP14 + Anion Gap (Completed)     Other   Opioid use disorder   Currently taking bupropion 8 mg 3 times daily.  Denies any  withdrawals -Continue bupropion 80 mg 3 times daily      ADHD   Home medications consist of Adderall 20 mg 4 times daily and risperidone  1 mg twice daily.  Compliant with medications. -Continue risperidone  and Adderall       Return if symptoms worsen or fail to improve.    Yosgar Demirjian  Jiah Bari, DO

## 2023-07-04 NOTE — Patient Instructions (Addendum)
 Thank you, [name] for allowing us  to provide your care today. Today we discussed:  For your Left Shoulder pain - Tylenol  500 mg, 2 tablets every 6 hours, MAX DOSE 4000 mg in 24 hours - You can also try OTC Voltaren gels, rest, ice and compression - Take Robaxin  750 mg three times a day for muscle pain.   I call you about your labs tomorrow.    I have ordered the following labs for you:  Lab Orders         CMP14 + Anion Gap       Tests ordered today:  CMP  Referrals ordered today:   Referral Orders  No referral(s) requested today     I have ordered the following medication/changed the following medications:   Stop the following medications: Medications Discontinued During This Encounter  Medication Reason   methocarbamol  (ROBAXIN ) 750 MG tablet Reorder     Start the following medications: Meds ordered this encounter  Medications   methocarbamol  (ROBAXIN ) 750 MG tablet    Sig: Take 1 tablet (750 mg total) by mouth 3 (three) times daily.    Dispense:  15 tablet    Refill:  0     Follow up: AS needed     Remember:   Should you have any questions or concerns please call the internal medicine clinic at [phone].     Brian Hobbs, D.O. [facility]

## 2023-07-05 DIAGNOSIS — K219 Gastro-esophageal reflux disease without esophagitis: Secondary | ICD-10-CM | POA: Insufficient documentation

## 2023-07-05 DIAGNOSIS — M7582 Other shoulder lesions, left shoulder: Secondary | ICD-10-CM | POA: Insufficient documentation

## 2023-07-05 DIAGNOSIS — I10 Essential (primary) hypertension: Secondary | ICD-10-CM | POA: Insufficient documentation

## 2023-07-05 LAB — CMP14 + ANION GAP
ALT: 42 IU/L (ref 0–44)
AST: 34 IU/L (ref 0–40)
Albumin: 4.5 g/dL (ref 4.1–5.1)
Alkaline Phosphatase: 76 IU/L (ref 44–121)
Anion Gap: 14 mmol/L (ref 10.0–18.0)
BUN/Creatinine Ratio: 13 (ref 9–20)
BUN: 20 mg/dL (ref 6–24)
Bilirubin Total: 0.2 mg/dL (ref 0.0–1.2)
CO2: 24 mmol/L (ref 20–29)
Calcium: 9.6 mg/dL (ref 8.7–10.2)
Chloride: 97 mmol/L (ref 96–106)
Creatinine, Ser: 1.51 mg/dL — ABNORMAL HIGH (ref 0.76–1.27)
Globulin, Total: 2.9 g/dL (ref 1.5–4.5)
Glucose: 104 mg/dL — ABNORMAL HIGH (ref 70–99)
Potassium: 4.8 mmol/L (ref 3.5–5.2)
Sodium: 135 mmol/L (ref 134–144)
Total Protein: 7.4 g/dL (ref 6.0–8.5)
eGFR: 58 mL/min/{1.73_m2} — ABNORMAL LOW (ref >59)

## 2023-07-05 NOTE — Assessment & Plan Note (Addendum)
 This is a 43 year old male who was recently admitted to the hospital on 06/29/2023 for AKI likely secondary to prerenal, discharged on 06/30/2023.  Patient presents today for for hospital follow-up, reports that he has been drinking plenty of water .  Denies any urinary symptoms.  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   - Will check CMP today  - Encouraged hydration and avoiding nephrotoxic agents

## 2023-07-05 NOTE — Assessment & Plan Note (Signed)
 Home medication consist of esomeprazole 20 mg daily, continue.

## 2023-07-05 NOTE — Assessment & Plan Note (Signed)
 Currently taking bupropion 8 mg 3 times daily.  Denies any withdrawals -Continue bupropion 80 mg 3 times daily

## 2023-07-05 NOTE — Assessment & Plan Note (Signed)
 Patient reports that last week Monday, he started to have left-sided chest pain that radiated to his left shoulder and intermittently down to his left forearm.  Described as a sharp pain, that comes and goes, worse at night when he tries to sleep.  Denies any burning sensation.  No numbness or tingling.  No weakness noted.  Reports that movement makes the pain better.  Reports that he is currently taking Tylenol  500 mg, 4 tablets every 6 hours.  States that he has not taken the Robaxin , it did not help him in the hospital.  During hospitalization, left shoulder x-ray did not show any acute fracture or dislocation.  Showed chronic flattening of the greater tubercular of the left humeral head, no evidence of arthropathy or other focal bony abnormalities.  Patient was offered physical therapy and conservative treatment such as rest, ice, Voltaren gel.  Patient reported that he did not want physical therapy as he cannot do that at his house.  Stated that he wants something stronger like an opiate, patient was advised against opiates.  Patient reported that he has previously tried Flexeril and Robaxin  and it did not help reported that he wants something stronger.  Patient was advised that for muscle pain, Flexeril and Robaxin  are the go to medications.  Patient reported that he will try the Robaxin  and will do exercise at home. -Robaxin -750 mg 3 times daily as needed for pain control -Patient was advised to take Tylenol  500 mg, 2 tablets every 6 hours, max dose of 4000 mg in 24 hours. - Will check LFTs

## 2023-07-05 NOTE — Assessment & Plan Note (Signed)
 BP Readings from Last 3 Encounters:  07/04/23 (!) 138/95  06/30/23 (!) 136/90  06/29/23 (!) 123/92   Patient was discharged with holding home medications losartan and HCTZ in the setting of AKI.  Patient was sent home with as needed clonidine, which he states that he has not used.  States that he is not taking the losartan and HCTZ as told by the admitted doctors.  Blood pressure in office is elevated to 138/95. -Will check BMP, serum creatinine prior to starting blood pressure medications

## 2023-07-05 NOTE — Assessment & Plan Note (Signed)
 Home medications consist of Adderall 20 mg 4 times daily and risperidone  1 mg twice daily.  Compliant with medications. -Continue risperidone  and Adderall

## 2023-07-06 ENCOUNTER — Ambulatory Visit: Payer: Self-pay | Admitting: Student

## 2023-07-06 ENCOUNTER — Telehealth: Payer: Self-pay | Admitting: *Deleted

## 2023-07-06 DIAGNOSIS — N179 Acute kidney failure, unspecified: Secondary | ICD-10-CM

## 2023-07-06 NOTE — Telephone Encounter (Signed)
 Copied from CRM [id number]. Topic: Clinical - Lab/Test Results >> Jul 06, 2023 10:48 AM Graeme ORN wrote: Reason for CRM: Patient called to check on lab results. Was supposed to receive a call yesterday. Let pt know have not yet been reviewed by provider. Patient would like a call back with results once reviewed. Thank You

## 2023-07-07 ENCOUNTER — Other Ambulatory Visit: Payer: Self-pay | Admitting: Student

## 2023-07-17 NOTE — Progress Notes (Signed)
 Internal Medicine Clinic Attending  Case discussed with the resident at the time of the visit.  We reviewed the resident's history and exam and pertinent patient test results.  I agree with the assessment, diagnosis, and plan of care documented in the resident's note.

## 2023-07-20 ENCOUNTER — Other Ambulatory Visit (INDEPENDENT_AMBULATORY_CARE_PROVIDER_SITE_OTHER)

## 2023-07-20 DIAGNOSIS — N179 Acute kidney failure, unspecified: Secondary | ICD-10-CM | POA: Diagnosis not present

## 2023-07-21 LAB — BMP8+ANION GAP
Anion Gap: 17 mmol/L (ref 10.0–18.0)
BUN/Creatinine Ratio: 10 (ref 9–20)
BUN: 12 mg/dL (ref 6–24)
CO2: 22 mmol/L (ref 20–29)
Calcium: 9.2 mg/dL (ref 8.7–10.2)
Chloride: 96 mmol/L (ref 96–106)
Creatinine, Ser: 1.23 mg/dL (ref 0.76–1.27)
Glucose: 175 mg/dL — ABNORMAL HIGH (ref 70–99)
Potassium: 4.4 mmol/L (ref 3.5–5.2)
Sodium: 135 mmol/L (ref 134–144)
eGFR: 75 mL/min/1.73 (ref >59)

## 2023-07-24 ENCOUNTER — Ambulatory Visit: Payer: Self-pay | Admitting: Student

## 2023-08-03 ENCOUNTER — Ambulatory Visit

## 2023-08-03 VITALS — BP 134/85 | HR 109 | Temp 98.9°F | Ht 72.0 in | Wt 306.8 lb

## 2023-08-03 DIAGNOSIS — R443 Hallucinations, unspecified: Secondary | ICD-10-CM | POA: Insufficient documentation

## 2023-08-03 DIAGNOSIS — F1010 Alcohol abuse, uncomplicated: Secondary | ICD-10-CM | POA: Diagnosis not present

## 2023-08-03 DIAGNOSIS — I10 Essential (primary) hypertension: Secondary | ICD-10-CM

## 2023-08-03 DIAGNOSIS — N179 Acute kidney failure, unspecified: Secondary | ICD-10-CM

## 2023-08-03 HISTORY — DX: Hallucinations, unspecified: R44.3

## 2023-08-03 NOTE — Assessment & Plan Note (Addendum)
 During hospital admission, patient reported drinking 1 pint of liquor at least 3 nights per week. Hx of alcohol withdrawal with DT 03/2021 Patient reported no drinking since hospitalization and denied any symptoms of withdrawal, seizures, or shaking.  -encouraged patient to continue with sobriety. Patient states he may start drinking again but will drink less. Patient states he has quit before without any issues. I encouraged patient that drinking in any amount can be harmful for health, and that if he starts drinking again to let us  know and to watch out for signs of withdrawal.

## 2023-08-03 NOTE — Assessment & Plan Note (Signed)
 Patient presenting for follow-up blood pressure appointment after hospitalization for AKI in setting of dehydration. Cr has been trending down  Discussed restarting Valsartan and holding hydrochlorothiazide due to job requiring intense physical activity in the heat and history of hypotension using hydrochlorothiazide during work. Patient also mentioned having a PCP family medicine doctor that monitors his BP. He stated this doctor does visits online and requested his labs to show this physician.  -BMP drawn today -patient stated he had his valsartan at home, will not require a refill, and will restart once I call/message about the labs.  -patient stated he would set up portal to have access to lab results  -encouraged patient to continue logging BP at home

## 2023-08-03 NOTE — Assessment & Plan Note (Addendum)
 Cr. 1.23 07/20/2023. Baseline <1 The patient had several questions regarding how he got his AKI/how to avoid another in the future.  I showed him the labs from his hospitalization and explained the etiology of his AKI. Instructed the patient to continue staying hydrated at work, take breaks from being in the sun when possible, and counseled patient on importance of avoiding NSAIDs/goody powders (he stated he doesn't use those things)  -BMP today

## 2023-08-03 NOTE — Assessment & Plan Note (Deleted)
 Patient prescribed Risperidone  1  mg BID for auditory and visual hallucinations

## 2023-08-03 NOTE — Progress Notes (Signed)
 CC: Follow-up  HPI:  Brian Hobbs is a 43 y.o. male with pertinent past medical history of HTN and AKI in recent hospitalization (1 month prior) (further medical history stated below) and presents today for follow-up. Please see problem based assessment and plan for additional details.  Last clinic appointment: 07/04/2023 for Blood Pressure management post AKI,   Last Hospitalization documented: 06/29/2023 for AKI in setting of dehydration-- held valsartan 80 mg BID and hydrochlorothiazide 25 mg  Last Pertinent Labs Documented: 07/20/2023 Cr: 1.23 (trending down from 6.17 --> 2.47 --> 1.67 -->1.51 during hospitalization)  Past Medical History:  Diagnosis Date   Hypertension     Current Outpatient Medications on File Prior to Visit  Medication Sig Dispense Refill   acetaminophen  (TYLENOL ) 500 MG tablet Take 1,000 mg by mouth 2 (two) times daily as needed for moderate pain (pain score 4-6), fever or headache.     amphetamine -dextroamphetamine (ADDERALL) 20 MG tablet Take 20 mg by mouth 4 (four) times daily.     anastrozole (ARIMIDEX) 1 MG tablet Take 1 mg by mouth every Sunday.     buprenorphine  (SUBUTEX ) 8 MG SUBL SL tablet Place 8 mg under the tongue 3 (three) times daily.     cloNIDine (CATAPRES) 0.1 MG tablet Take 0.1 mg by mouth 2 (two) times daily.     Esomeprazole Magnesium (NEXIUM 24HR) 20 MG TBEC Take 20 mg by mouth daily.     [Paused] hydrochlorothiazide (HYDRODIURIL) 25 MG tablet Take 25 mg by mouth daily.     methocarbamol  (ROBAXIN ) 750 MG tablet Take 1 tablet (750 mg total) by mouth 3 (three) times daily. 15 tablet 0   risperiDONE  (RISPERDAL ) 1 MG tablet Take 1 mg by mouth 2 (two) times daily.     sildenafil (VIAGRA) 50 MG tablet Take 50 mg by mouth daily as needed.     testosterone cypionate (DEPOTESTOSTERONE CYPIONATE) 200 MG/ML injection Inject 200 mg into the muscle every Sunday.     [Paused] valsartan (DIOVAN) 80 MG tablet Take 80-160 mg by mouth See admin  instructions. Take 2 tablets (160mg ) by mouth every morning and 1 tablet (80mg ) every evening.     No current facility-administered medications on file prior to visit.    No family history on file.  Social History   Socioeconomic History   Marital status: Single    Spouse name: Not on file   Number of children: Not on file   Years of education: Not on file   Highest education level: Not on file  Occupational History   Not on file  Tobacco Use   Smoking status: Every Day   Smokeless tobacco: Not on file  Vaping Use   Vaping status: Every Day  Substance and Sexual Activity   Alcohol use: Yes    Comment: occ   Drug use: No   Sexual activity: Not on file  Other Topics Concern   Not on file  Social History Narrative   Not on file   Social Drivers of Health   Financial Resource Strain: Medium Risk (05/23/2022)   Received from [facility]   Overall Financial Resource Strain (CARDIA)    Difficulty of Paying Living Expenses: Somewhat hard  Food Insecurity: No Food Insecurity (06/29/2023)   Hunger Vital Sign    Worried About Running Out of Food in the Last Year: Never true    Ran Out of Food in the Last Year: Never true  Transportation Needs: No Transportation Needs (06/29/2023)   PRAPARE -  Administrator, Civil Service (Medical): No    Lack of Transportation (Non-Medical): No  Physical Activity: Unknown (05/23/2022)   Received from [facility]   Exercise Vital Sign    On average, how many days per week do you engage in moderate to strenuous exercise (like a brisk walk)?: 0 days    Minutes of Exercise per Session: Not on file  Stress: No Stress Concern Present (12/09/2022)   Received from [facility] of Occupational Health - Occupational Stress Questionnaire    Feeling of Stress : Only a little  Social Connections: Somewhat Isolated (05/23/2022)   Received from [facility]   Social Network    How would you rate your social network  (family, work, friends)?: Restricted participation with some degree of social isolation  Intimate Partner Violence: Not At Risk (06/29/2023)   Humiliation, Afraid, Rape, and Kick questionnaire    Fear of Current or Ex-Partner: No    Emotionally Abused: No    Physically Abused: No    Sexually Abused: No    Review of Systems: Review of Systems  Respiratory:  Negative for shortness of breath.   Cardiovascular:  Negative for chest pain.  Gastrointestinal:  Negative for blood in stool.  Genitourinary:  Negative for dysuria and hematuria.  Neurological:  Negative for dizziness and seizures.  Psychiatric/Behavioral:  Negative for substance abuse.      Vitals:   08/03/23 1519  BP: 134/85  Pulse: (!) 109  Temp: 98.9 F (37.2 C)  TempSrc: Oral  SpO2: 93%  Weight: (!) 306 lb 12.8 oz (139.2 kg)  Height: 6' (1.829 m)    Physical Exam: Physical Exam Cardiovascular:     Rate and Rhythm: Normal rate and regular rhythm.     Heart sounds: Normal heart sounds. No murmur heard.    No friction rub. No gallop.  Pulmonary:     Effort: Pulmonary effort is normal.     Breath sounds: Normal breath sounds. No wheezing, rhonchi or rales.  Skin:    General: Skin is warm and dry.  Neurological:     Mental Status: He is alert.      Assessment & Plan:   Patient seen with Dr. Jeanelle  Assessment & Plan Primary hypertension Patient presenting for follow-up blood pressure appointment after hospitalization for AKI in setting of dehydration. Cr has been trending down  Discussed restarting Valsartan and holding hydrochlorothiazide due to job requiring intense physical activity in the heat and history of hypotension using hydrochlorothiazide during work. Patient also mentioned having a PCP family medicine doctor that monitors his BP. He stated this doctor does visits online and requested his labs to show this physician.  -BMP drawn today -patient stated he had his valsartan at home, will not  require a refill, and will restart once I call/message about the labs.  -patient stated he would set up portal to have access to lab results  -encouraged patient to continue logging BP at home  AKI (acute kidney injury) (HCC) Cr. 1.23 07/20/2023. Baseline <1 The patient had several questions regarding how he got his AKI/how to avoid another in the future.  I showed him the labs from his hospitalization and explained the etiology of his AKI. Instructed the patient to continue staying hydrated at work, take breaks from being in the sun when possible, and counseled patient on importance of avoiding NSAIDs/goody powders (he stated he doesn't use those things)  -BMP today Alcohol abuse During hospital admission, patient  reported drinking 1 pint of liquor at least 3 nights per week. Hx of alcohol withdrawal with DT 03/2021 Patient reported no drinking since hospitalization and denied any symptoms of withdrawal, seizures, or shaking.  -encouraged patient to continue with sobriety. Patient states he may start drinking again but will drink less. Patient states he has quit before without any issues. I encouraged patient that drinking in any amount can be harmful for health, and that if he starts drinking again to let us  know and to watch out for signs of withdrawal.    Orders Placed This Encounter  Procedures   Basic metabolic panel with GFR     Sallyanne Primas, D.O. [facility], PGY-1 Date 08/03/2023 Time 4:24 PM

## 2023-08-03 NOTE — Patient Instructions (Signed)
 Today we discussed the following medical conditions and plan:   Restarting your blood pressure medication Valsartan after you hear back from me regarding your blood work.  Stop taking the Hydrochlorothiazide.  Stay hydrated at work and take breaks if you feel you've been in the sun too long.   Keep a log of your blood pressure. Our goal for you is 130/80.   I would encourage you to continue your sobriety and to stop smoking.   Once you set up your portal, I will either message you or call you with the lab results.   We look forward to seeing you next time. Please call our clinic at [phone] if you have any questions or concerns. The best time to call is Monday-Friday from 9am-4pm, but there is someone available 24/7. If you need medication refills, please notify your pharmacy one week in advance and they will send us  a request.   Thank you for trusting me with your care. Wishing you the best!   Sallyanne Primas, DO  [facility]

## 2023-08-04 ENCOUNTER — Ambulatory Visit: Payer: Self-pay

## 2023-08-04 DIAGNOSIS — Y92410 Unspecified street and highway as the place of occurrence of the external cause: Secondary | ICD-10-CM | POA: Diagnosis not present

## 2023-08-04 DIAGNOSIS — M79652 Pain in left thigh: Secondary | ICD-10-CM | POA: Diagnosis not present

## 2023-08-04 DIAGNOSIS — S51012A Laceration without foreign body of left elbow, initial encounter: Secondary | ICD-10-CM | POA: Diagnosis not present

## 2023-08-04 DIAGNOSIS — Z23 Encounter for immunization: Secondary | ICD-10-CM | POA: Diagnosis not present

## 2023-08-04 DIAGNOSIS — M25522 Pain in left elbow: Secondary | ICD-10-CM | POA: Diagnosis not present

## 2023-08-04 DIAGNOSIS — R42 Dizziness and giddiness: Secondary | ICD-10-CM | POA: Diagnosis not present

## 2023-08-04 DIAGNOSIS — M7989 Other specified soft tissue disorders: Secondary | ICD-10-CM | POA: Diagnosis not present

## 2023-08-04 DIAGNOSIS — R58 Hemorrhage, not elsewhere classified: Secondary | ICD-10-CM | POA: Diagnosis not present

## 2023-08-04 DIAGNOSIS — M25562 Pain in left knee: Secondary | ICD-10-CM | POA: Diagnosis not present

## 2023-08-04 DIAGNOSIS — J018 Other acute sinusitis: Secondary | ICD-10-CM | POA: Diagnosis not present

## 2023-08-04 LAB — BASIC METABOLIC PANEL WITH GFR
BUN/Creatinine Ratio: 8 — ABNORMAL LOW (ref 9–20)
BUN: 11 mg/dL (ref 6–24)
CO2: 21 mmol/L (ref 20–29)
Calcium: 9.8 mg/dL (ref 8.7–10.2)
Chloride: 95 mmol/L — ABNORMAL LOW (ref 96–106)
Creatinine, Ser: 1.33 mg/dL — ABNORMAL HIGH (ref 0.76–1.27)
Glucose: 101 mg/dL — ABNORMAL HIGH (ref 70–99)
Potassium: 4.1 mmol/L (ref 3.5–5.2)
Sodium: 135 mmol/L (ref 134–144)
eGFR: 68 mL/min/1.73 (ref >59)

## 2023-08-04 NOTE — Telephone Encounter (Signed)
 Spoke with patient about labs on the phone and helped set up Mychart  Stated the following:   Your Cr (kidney number) elevated slightly. It was at 1.23 and is now at 1.33.  This small increase should not stop you from restarting your blood pressure medication. I would encourage you to hydrate well during the day and we can see you back at your next appointment to repeat labs. It is likely that your new baseline is going to be around  1.0. It is common after an incident like you had to not return to exactly where you were before the hospitalization. F/u 2-3 weeks for labs. Patient stated understanding of the above information.    Brian Primas, DO [facility]

## 2023-08-05 NOTE — Progress Notes (Signed)
 Internal Medicine Clinic Attending  I was physically present during the key portions of the resident provided service and participated in the medical decision making of patient's management care. I reviewed pertinent patient test results.  The assessment, diagnosis, and plan were formulated together and I agree with the documentation in the resident's note.  Carney Living, MD

## 2023-08-16 ENCOUNTER — Telehealth: Payer: Self-pay | Admitting: Student

## 2023-08-16 DIAGNOSIS — Z79899 Other long term (current) drug therapy: Secondary | ICD-10-CM | POA: Diagnosis not present

## 2023-08-16 NOTE — Telephone Encounter (Signed)
 Please refer to message below.  I was able to speak with patient via telephone this afternoon per the request of Dr. Benuel to schedule future appointment.  Patient was agreeable to come in for an appointment this Friday 08/18/23 at 10:30 am.  Forwarding message to Dr. Benuel to make her aware.
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
## 2023-08-18 ENCOUNTER — Ambulatory Visit

## 2023-08-18 ENCOUNTER — Other Ambulatory Visit: Payer: Self-pay

## 2023-08-18 VITALS — BP 118/71 | HR 102 | Temp 97.5°F | Ht 73.0 in | Wt 299.6 lb

## 2023-08-18 DIAGNOSIS — T148XXA Other injury of unspecified body region, initial encounter: Secondary | ICD-10-CM | POA: Diagnosis not present

## 2023-08-18 DIAGNOSIS — Z4802 Encounter for removal of sutures: Secondary | ICD-10-CM

## 2023-08-18 DIAGNOSIS — I10 Essential (primary) hypertension: Secondary | ICD-10-CM

## 2023-08-18 DIAGNOSIS — N179 Acute kidney failure, unspecified: Secondary | ICD-10-CM

## 2023-08-18 DIAGNOSIS — J018 Other acute sinusitis: Secondary | ICD-10-CM | POA: Diagnosis not present

## 2023-08-18 DIAGNOSIS — F1010 Alcohol abuse, uncomplicated: Secondary | ICD-10-CM

## 2023-08-18 NOTE — Assessment & Plan Note (Signed)
 Patient reported taking losartan 80 mg BID since last leaving clinic, however did not take at all last week. This past week has taken daily and BP at goal today 1180/71.  Lab Results  Component Value Date   CREATININE 1.33 (H) 08/03/2023   CREATININE 1.23 07/20/2023   CREATININE 1.51 (H) 07/04/2023  -continue losartan 80 mg BID -continue to stay hydrated during work  -repeat BMP today
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
## 2023-08-18 NOTE — Assessment & Plan Note (Signed)
 [facility] ED note reports 7 sutures applied to left elbow for laceration.  Removal of 6 3-0 prolene sutures on 4 cm closed laceration. Cleaned with iodine. Removed with suture removal kit scissors and tweezers. Very minimal bleeding stopped with light pressure with gauze.  Patient reports scratching pretty vigorously one night and that one of the sutures might have fallen out. Attending was present during removal and seventh suture was not visualized.  -instructed patient to refrain from scratching, clean gently with water , and return to clinic if any new signs of infection (redness or heat), new excessive bleeding or swelling, new fevers or chills.

## 2023-08-18 NOTE — Patient Instructions (Addendum)
 Today we discussed the following medical conditions and plan:   Keep taking your Valsartan 80 mg, 1 in the morning and 1 in the afternoon.   I will call you with the blood work about your kidneys.  Do not scratch your left elbow. Clean gently with water .  Let us  know if you have any redness or new excessive bleeding, new fevers or chills, come to clinic sooner to be evaluated.   For your leg, leave it alone. It is likely a blood pocket that will absorb in several months time.   We look forward to seeing you next time. Please call our clinic at [phone] if you have any questions or concerns. The best time to call is Monday-Friday from 9am-4pm, but there is someone available 24/7. If you need medication refills, please notify your pharmacy one week in advance and they will send us  a request.   Thank you for trusting me with your care. Wishing you the best!   Sallyanne Primas, DO  [facility]

## 2023-08-19 LAB — BASIC METABOLIC PANEL WITH GFR
BUN/Creatinine Ratio: 12 (ref 9–20)
BUN: 15 mg/dL (ref 6–24)
CO2: 21 mmol/L (ref 20–29)
Calcium: 9.4 mg/dL (ref 8.7–10.2)
Chloride: 93 mmol/L — ABNORMAL LOW (ref 96–106)
Creatinine, Ser: 1.3 mg/dL — ABNORMAL HIGH (ref 0.76–1.27)
Glucose: 117 mg/dL — ABNORMAL HIGH (ref 70–99)
Potassium: 4.5 mmol/L (ref 3.5–5.2)
Sodium: 133 mmol/L — ABNORMAL LOW (ref 134–144)
eGFR: 69 mL/min/1.73 (ref >59)

## 2023-08-20 NOTE — Progress Notes (Signed)
 Internal Medicine Clinic Attending  I was physically present during the key portions of the resident provided service and participated in the medical decision making of patient's management care. I reviewed pertinent patient test results.  The assessment, diagnosis, and plan were formulated together and I agree with the documentation in the resident's note.  Jeanelle Layman CROME, MD

## 2023-08-21 ENCOUNTER — Ambulatory Visit: Payer: Self-pay

## 2023-08-23 DIAGNOSIS — F1120 Opioid dependence, uncomplicated: Secondary | ICD-10-CM | POA: Diagnosis not present

## 2023-11-27 ENCOUNTER — Telehealth: Payer: Self-pay | Admitting: *Deleted

## 2023-11-27 NOTE — Telephone Encounter (Signed)
+  Will forward to scheduler to make a follow up appointment for patient.   Copied from CRM #[id number]. Topic: Clinical - Request for Lab/Test Order >> Nov 24, 2023  2:04 PM Mercer PEDLAR wrote: Reason for CRM: Patient would like to do labs to get his kidneys checked.

## 2023-11-27 NOTE — Telephone Encounter (Signed)
 Appt scheduled for 12/08/23 at 0815

## 2023-12-04 DIAGNOSIS — Z79899 Other long term (current) drug therapy: Secondary | ICD-10-CM | POA: Diagnosis not present

## 2023-12-08 ENCOUNTER — Ambulatory Visit

## 2023-12-08 VITALS — BP 115/71 | HR 103 | Temp 97.8°F | Ht 73.0 in | Wt 302.6 lb

## 2023-12-08 DIAGNOSIS — N179 Acute kidney failure, unspecified: Secondary | ICD-10-CM | POA: Diagnosis not present

## 2023-12-08 DIAGNOSIS — I10 Essential (primary) hypertension: Secondary | ICD-10-CM | POA: Diagnosis not present

## 2023-12-08 DIAGNOSIS — F17210 Nicotine dependence, cigarettes, uncomplicated: Secondary | ICD-10-CM

## 2023-12-08 DIAGNOSIS — Z79899 Other long term (current) drug therapy: Secondary | ICD-10-CM

## 2023-12-08 NOTE — Assessment & Plan Note (Addendum)
 Blood pressure at goal today 115/71.  He takes valsartan 80 mg BID. He was hospitalized from 6/26 - 06/30/2023 for AKI with creatinine of 6.17.  At discharge, he was instructed to pause taking his hydrochlorothiazide and valsartan.  At his HFU, he was instructed to continue valsartan, but discontinue hydrochlorothiazide. Last Cr in 08/2023 was 1.30. Today he endorses that he takes valsartan 80 mg twice daily, which she has been taking consistently since his last visit.  He is wishing to recheck his kidney function today, but otherwise has no complaints.  He states that he does not need any refills of any medications.  I will order a BMP and call him with the results. - Continue Valsartan 80 mg BID - f/u BMP results

## 2023-12-08 NOTE — Assessment & Plan Note (Addendum)
 Patient was hospitalized for AKI from 6/26 - 06/30/2023 with creatinine of 6.17.  At discharge, he was instructed to pause taking his hydrochlorothiazide and valsartan.  At his HFU, he was instructed to continue valsartan, but discontinue hydrochlorothiazide. Last Cr in 08/2023 was 1.30. Today he endorses that he takes valsartan 80 mg twice daily, which she has been taking consistently since his last visit.  He is wishing to recheck his kidney function today, but otherwise has no complaints.  He states that he does not need any refills of any medications.  I will order a BMP and call him with the results. - Continue Valsartan 80 mg BID - f/u BMP results
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
## 2023-12-08 NOTE — Patient Instructions (Signed)
 Thank you, Mr.Wheeler E Lumpkin for allowing us  to provide your care today. Today we discussed the following:  - I will call you with your lab results  I have ordered the following labs for you:   Lab Orders         Basic metabolic panel with GFR       Follow up: for annual visit     Should you have any questions or concerns please call the Internal Medicine Clinic at [phone].     Doyal Miyamoto, MD [facility]

## 2023-12-09 LAB — BASIC METABOLIC PANEL WITH GFR
BUN/Creatinine Ratio: 12 (ref 9–20)
BUN: 13 mg/dL (ref 6–24)
CO2: 21 mmol/L (ref 20–29)
Calcium: 9.1 mg/dL (ref 8.7–10.2)
Chloride: 99 mmol/L (ref 96–106)
Creatinine, Ser: 1.1 mg/dL (ref 0.76–1.27)
Glucose: 136 mg/dL — ABNORMAL HIGH (ref 70–99)
Potassium: 4.8 mmol/L (ref 3.5–5.2)
Sodium: 136 mmol/L (ref 134–144)
eGFR: 85 mL/min/1.73 (ref >59)

## 2023-12-11 ENCOUNTER — Ambulatory Visit: Payer: Self-pay

## 2023-12-11 ENCOUNTER — Other Ambulatory Visit: Payer: Self-pay

## 2023-12-11 DIAGNOSIS — I10 Essential (primary) hypertension: Secondary | ICD-10-CM

## 2023-12-11 MED ORDER — VALSARTAN 80 MG PO TABS: 80.0000 mg | ORAL_TABLET | Freq: Two times a day (BID) | ORAL | Status: AC

## 2023-12-11 NOTE — Progress Notes (Signed)
 Internal Medicine Clinic Attending  Case discussed with the resident at the time of the visit.  We reviewed the resident's history and exam and pertinent patient test results.  I agree with the assessment, diagnosis, and plan of care documented in the resident's note.
